# Patient Record
Sex: Male | Born: 1940 | Race: White | Hispanic: No | Marital: Married | Smoking: Former smoker
Health system: Southern US, Community
[De-identification: ages and names within clinical notes are randomized; demographics above are authoritative.]

## PROBLEM LIST (undated history)

## (undated) DIAGNOSIS — I2510 Atherosclerotic heart disease of native coronary artery without angina pectoris: Secondary | ICD-10-CM

## (undated) DIAGNOSIS — I10 Essential (primary) hypertension: Secondary | ICD-10-CM

## (undated) DIAGNOSIS — I9789 Other postprocedural complications and disorders of the circulatory system, not elsewhere classified: Secondary | ICD-10-CM

## (undated) DIAGNOSIS — E785 Hyperlipidemia, unspecified: Secondary | ICD-10-CM

## (undated) DIAGNOSIS — C4490 Unspecified malignant neoplasm of skin, unspecified: Secondary | ICD-10-CM

## (undated) DIAGNOSIS — I2119 ST elevation (STEMI) myocardial infarction involving other coronary artery of inferior wall: Secondary | ICD-10-CM

## (undated) DIAGNOSIS — Z8701 Personal history of pneumonia (recurrent): Secondary | ICD-10-CM

## (undated) DIAGNOSIS — M1990 Unspecified osteoarthritis, unspecified site: Secondary | ICD-10-CM

## (undated) DIAGNOSIS — I4891 Unspecified atrial fibrillation: Secondary | ICD-10-CM

## (undated) HISTORY — DX: Hyperlipidemia, unspecified: E78.5

## (undated) HISTORY — PX: HERNIA REPAIR: SHX51

## (undated) HISTORY — DX: Other postprocedural complications and disorders of the circulatory system, not elsewhere classified: I97.89

## (undated) HISTORY — PX: COLONOSCOPY: SHX5424

## (undated) HISTORY — DX: ST elevation (STEMI) myocardial infarction involving other coronary artery of inferior wall: I21.19

## (undated) HISTORY — DX: Personal history of pneumonia (recurrent): Z87.01

## (undated) HISTORY — DX: Essential (primary) hypertension: I10

## (undated) HISTORY — DX: Atherosclerotic heart disease of native coronary artery without angina pectoris: I25.10

## (undated) HISTORY — DX: Unspecified atrial fibrillation: I48.91

## (undated) HISTORY — PX: CARDIAC CATHETERIZATION: SHX172

---

## 2012-08-29 ENCOUNTER — Other Ambulatory Visit: Payer: Self-pay | Admitting: Family Medicine

## 2012-08-29 DIAGNOSIS — M549 Dorsalgia, unspecified: Secondary | ICD-10-CM

## 2012-09-03 ENCOUNTER — Ambulatory Visit
Admission: RE | Admit: 2012-09-03 | Discharge: 2012-09-03 | Disposition: A | Discharge status: Home / Self Care | Payer: Medicare Other | Source: Ambulatory Visit | Attending: Family Medicine | Admitting: Family Medicine

## 2012-09-03 VITALS — BP 155/85 | HR 62 | Ht 67.0 in | Wt 156.0 lb

## 2012-09-03 DIAGNOSIS — M549 Dorsalgia, unspecified: Secondary | ICD-10-CM

## 2012-09-03 MED ORDER — METHYLPREDNISOLONE ACETATE 40 MG/ML INJ SUSP (RADIOLOG
120.0000 mg | Freq: Once | INTRAMUSCULAR | Status: AC
  Administered 2012-09-03: 120 mg via EPIDURAL

## 2012-09-03 MED ORDER — IOHEXOL 180 MG/ML  SOLN
1.0000 mL | Freq: Once | INTRAMUSCULAR | Status: AC | PRN
  Administered 2012-09-03: 1 mL via EPIDURAL

## 2015-04-26 DIAGNOSIS — I2119 ST elevation (STEMI) myocardial infarction involving other coronary artery of inferior wall: Secondary | ICD-10-CM

## 2015-04-26 HISTORY — DX: ST elevation (STEMI) myocardial infarction involving other coronary artery of inferior wall: I21.19

## 2015-05-23 HISTORY — PX: CORONARY ANGIOPLASTY: SHX604

## 2015-06-08 ENCOUNTER — Encounter: Payer: Self-pay | Admitting: *Deleted

## 2015-06-16 ENCOUNTER — Other Ambulatory Visit: Payer: Self-pay | Admitting: *Deleted

## 2015-06-16 ENCOUNTER — Encounter: Payer: Self-pay | Admitting: Surgery

## 2015-06-16 ENCOUNTER — Institutional Professional Consult (permissible substitution) (INDEPENDENT_AMBULATORY_CARE_PROVIDER_SITE_OTHER): Payer: Commercial Managed Care - HMO | Admitting: Surgery

## 2015-06-16 VITALS — BP 112/70 | HR 60 | Resp 20 | Ht 67.0 in | Wt 165.0 lb

## 2015-06-16 DIAGNOSIS — I2111 ST elevation (STEMI) myocardial infarction involving right coronary artery: Secondary | ICD-10-CM

## 2015-06-16 DIAGNOSIS — I25110 Atherosclerotic heart disease of native coronary artery with unstable angina pectoris: Secondary | ICD-10-CM | POA: Diagnosis not present

## 2015-06-16 DIAGNOSIS — Z955 Presence of coronary angioplasty implant and graft: Secondary | ICD-10-CM

## 2015-06-16 DIAGNOSIS — I2510 Atherosclerotic heart disease of native coronary artery without angina pectoris: Secondary | ICD-10-CM

## 2015-06-17 ENCOUNTER — Encounter (HOSPITAL_COMMUNITY): Payer: Commercial Managed Care - HMO

## 2015-06-18 ENCOUNTER — Encounter: Payer: Self-pay | Admitting: Surgery

## 2015-06-18 DIAGNOSIS — I2111 ST elevation (STEMI) myocardial infarction involving right coronary artery: Secondary | ICD-10-CM | POA: Insufficient documentation

## 2015-06-18 DIAGNOSIS — I2510 Atherosclerotic heart disease of native coronary artery without angina pectoris: Secondary | ICD-10-CM | POA: Insufficient documentation

## 2015-06-18 DIAGNOSIS — Z955 Presence of coronary angioplasty implant and graft: Secondary | ICD-10-CM | POA: Insufficient documentation

## 2015-06-18 NOTE — Progress Notes (Signed)
Cardiothoracic Surgery Consultation  PCP is Curlene Labrum, MD Referring Provider is Burdine, Virgina Evener, MD  Chief Complaint  Patient presents with  . Coronary Artery Disease    surgical eval for possible CABG, Cardiac Cath @ [facility]    HPI:  The patient is a 74 year old gentleman with a history of hypertension and hyperlipidemia who was traveling in [place] and suffered and inferior STEMI on 05/23/2015. He developed substernal chest discomfort described as pressure radiating to both arms and associated with diaphoresis and shortness of breath.  Cath showed an occluded RCA as well as severe multi-vessel coronary artery disease. He underwent successful PCI of the RCA with 2 drug eluting stents (Synergy 3.5 x 38 mm and 3.5 x 20 mm). The peak troponin I that was measured was 3.26. He had an uneventful course and was discharged on ASA and Plavix. He followed up with Dr. Pleas Koch and was referred to me for consideration of CABG. He says that he has felt well since discharge with no chest discomfort or shortness of breath.  Past Medical History  Diagnosis Date  . CAD (coronary artery disease)   . HLD (hyperlipidemia)   . Hypertension   . Heart attack     Past Surgical History  Procedure Laterality Date  . Hernia repair    . Coronary angioplasty with stent placement      Family History  Problem Relation Age of Onset  . Heart disease    . Hyperlipidemia Mother   . Cancer Father   . Hyperlipidemia Father   . Hypertension Father   . Heart disease Father   . Kidney cancer Father   . Leukemia Father     Social History Social History  Substance Use Topics  . Smoking status: Former Smoker    Types: Cigarettes    Start date: 09/25/1974  . Smokeless tobacco: None  . Alcohol Use: No    Current Outpatient Prescriptions  Medication Sig Dispense Refill  . aspirin 81 MG chewable tablet Chew 81 mg by mouth daily.   1  . atorvastatin (LIPITOR) 80 MG tablet Take 80  mg by mouth daily at 6 PM.   1  . clopidogrel (PLAVIX) 75 MG tablet Take 75 mg by mouth daily.   3  . co-enzyme Q-10 30 MG capsule     . lisinopril (PRINIVIL,ZESTRIL) 2.5 MG tablet Take 2.5 mg by mouth daily.   3  . metoprolol succinate (TOPROL-XL) 25 MG 24 hr tablet Take 25 mg by mouth daily.   0  . NITROSTAT 0.4 MG SL tablet Place 0.4 mg under the tongue every 5 (five) minutes as needed.   2  . vitamin B-12 (CYANOCOBALAMIN) 100 MCG tablet      No current facility-administered medications for this visit.    No Known Allergies  Review of Systems  Constitutional: Negative.  Negative for fatigue.  HENT: Negative.   Eyes: Negative.   Respiratory: Negative.  Negative for shortness of breath.   Cardiovascular: Negative for chest pain, palpitations and leg swelling.  Gastrointestinal: Negative.   Endocrine: Negative.   Genitourinary: Negative.   Musculoskeletal: Negative.   Skin: Negative.   Neurological: Negative.   Hematological: Negative.   Psychiatric/Behavioral: Negative.     BP 112/70 mmHg  Pulse 60  Resp 20  Ht 5\' 7"  (1.702 m)  Wt 165 lb (74.844 kg)  BMI 25.84 kg/m2  SpO2 98% Physical Exam  Constitutional: He is oriented to person, place, and time. He  appears well-developed and well-nourished.  HENT:  Head: Normocephalic and atraumatic.  Mouth/Throat: Oropharynx is clear and moist.  Eyes: EOM are normal. Pupils are equal, round, and reactive to light.  Neck: Normal range of motion. Neck supple. No JVD present. No tracheal deviation present. No thyromegaly present.  Cardiovascular: Normal rate, regular rhythm, normal heart sounds and intact distal pulses.  Exam reveals no gallop and no friction rub.   No murmur heard. Pulmonary/Chest: Effort normal and breath sounds normal. No respiratory distress. He has no wheezes. He has no rales.  Abdominal: Soft. Bowel sounds are normal. He exhibits no distension and no mass. There is no tenderness.  Musculoskeletal: Normal range  of motion. He exhibits no edema.  Lymphadenopathy:    He has no cervical adenopathy.  Neurological: He is alert and oriented to person, place, and time. He has normal strength. No cranial nerve deficit or sensory deficit.  Skin: Skin is warm and dry.  Psychiatric: He has a normal mood and affect.     Diagnostic Tests:  I have personally reviewed his cath films from his hospitalization in [place]. This shows an acute thrombotic occlusion of the proximal RCA with collaterals to the distal RCA from the left. The PDA is occluded at its origin with filling of the vessel by collat from the left. The LAD has a 70-80% proximal stenosis before the diagonal branch. The medial branch of the diagonal has 95% stenosis and is a moderate sized vessel. The LCX has an occluded OM that fills by bridging collat and is moderate sized. LV function is normal. The RCA was opened with PCI and stented proximally and mid. There is moderate distal disease before the PL branch which has 70% mid and 90% distal stenoses.   Impression:  He has severe 3-vessel coronary disease presenting with an acute inferior STEMI due to occlusion of the proximal RCA. This was opened successfully with 2 DES with mild elevation of the troponin and normal LV function. He is asymptomatic at this time on ASA and Plavix. I think CABG is indicated given the severity of his other disease, particularly the LAD/diag and OM. He has an appt next week to see Dr. Domenic Polite in [facility] for initial cardiology evaluation and I will wait to see his recommendation. He would need to be on DAPT for at least a month before stopping the Plavix for surgery and then would need these restarted afterwards.   Plan:  I have tentatively scheduled him for CABG on 07/08/2015 at his request but told him that this may change depending on recommendations from Dr. Domenic Polite after his visit.   Gaye Pollack, MD [facility] [phone]

## 2015-06-24 ENCOUNTER — Encounter: Payer: Self-pay | Admitting: Cardiology

## 2015-06-24 ENCOUNTER — Ambulatory Visit (INDEPENDENT_AMBULATORY_CARE_PROVIDER_SITE_OTHER): Payer: Commercial Managed Care - HMO | Admitting: Cardiology

## 2015-06-24 ENCOUNTER — Other Ambulatory Visit: Payer: Self-pay

## 2015-06-24 ENCOUNTER — Telehealth: Payer: Self-pay | Admitting: *Deleted

## 2015-06-24 ENCOUNTER — Ambulatory Visit (INDEPENDENT_AMBULATORY_CARE_PROVIDER_SITE_OTHER): Payer: Commercial Managed Care - HMO

## 2015-06-24 VITALS — BP 108/70 | HR 55 | Ht 67.0 in | Wt 166.0 lb

## 2015-06-24 DIAGNOSIS — I2510 Atherosclerotic heart disease of native coronary artery without angina pectoris: Secondary | ICD-10-CM

## 2015-06-24 DIAGNOSIS — I2111 ST elevation (STEMI) myocardial infarction involving right coronary artery: Secondary | ICD-10-CM | POA: Diagnosis not present

## 2015-06-24 NOTE — Telephone Encounter (Signed)
-----   Message from Satira Sark, MD sent at 06/24/2015  4:30 PM EDT ----- Reviewed. LVEF normal range with basal inferior hypokinesis likely consistent with his recent infarct.

## 2015-06-24 NOTE — Telephone Encounter (Signed)
Patient informed. 

## 2015-06-24 NOTE — Progress Notes (Signed)
Cardiology Office Note  Date: 06/24/2015   ID: MAURICE FOTHERINGHAM, DOB 11-30-1940, MRN [id number]  PCP: Curlene Labrum, MD  Consulting Cardiologist: Rozann Lesches, MD   Chief Complaint  Patient presents with  . Coronary Artery Disease    History of Present Illness: AARYA QUEBEDEAUX is a 74 y.o. male referred for cardiology consultation by Dr. Pleas Koch. Available records indicate that the patient was visiting in [place] back in August at which time he developed chest pain and was hospitalized at [facility] in [place] with an inferior wall STEMI. He underwent placement of DES (Synergy 3.5 x 38 mm and 3.5 x 20 mm) to the proximal and mid RCA (culprit lesions), but was also found to have multivessel distribution residual CAD, and ultimately CABG was recommended. I see that he has actually already been evaluated by Dr. Cyndia Bent with [facility] who had the opportunity to review the patient's cardiac catheterization films. LAD was described as having a 70-80% proximal stenosis prior to the diagonal branch, 95% diagonal, occluded OM associated with bridging collaterals, also occluded PDA at the origin. CABG was recommended, and he is in fact already scheduled for early October.  He is here today with his wife. He states that he has been compliant with his medications outlined below, has had occasional brief episodes of chest discomfort without obvious precipitant since hospital discharge in August. He reports NYHA class II dyspnea. He is not pursuing any heavy exertion at this time, has been doing basic ADLs and walking. He had several questions about his overall status, appropriate diet, and was under the impression that we would decide today whether he needed surgery or not.  Labwork from [place] is outlined below, peak troponin I was 23.9. It looks like an echocardiogram was done, however I could not find the results within the records.  The patient and his wife have lived in [place] for about 5  years. Prior to that they lived in the [place] area, decided to move due to the high cost of living. It sounds like they essentially picked [place] off of the map with no other contacts in this area. 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  We had a lengthy discussion today about his presentation with myocardial infarction, findings of his cardiac catheterization based on available information, general indications for coronary revascularization, appropriate medical therapy for risk reduction, and also appropriate diet and ultimately regular exercise plan. He does recall being told by the "chief of cardiology" in [place] that he needed to have CABG, and that PCI was not a good option. I explained that I would have one of our interventional cardiologist review his films in detail sit that we could confirm this recommendation. We also discussed getting an echocardiogram so that LVEF is known at baseline.   Past Medical History  Diagnosis Date  . CAD (coronary artery disease)     DES to proximal and mid RCA May 23, 2015 - [place]  . Hyperlipidemia   . Essential hypertension   . Inferior STEMI August 2016    Past Surgical History  Procedure Laterality Date  . Hernia repair      Current Outpatient Prescriptions  Medication Sig Dispense Refill  . aspirin 81 MG chewable tablet Chew 81 mg by mouth daily.   1  . atorvastatin (LIPITOR) 80 MG tablet Take 80 mg by mouth daily at 6 PM.   1  .  clopidogrel (PLAVIX) 75 MG tablet Take 75 mg by mouth daily.   3  . lisinopril (PRINIVIL,ZESTRIL) 2.5 MG tablet Take 2.5 mg by mouth daily.   3  . Magnesium 100 MG TABS Take 1 tablet by mouth daily.    . metoprolol succinate (TOPROL-XL) 25 MG 24 hr tablet Take 25 mg by mouth daily.   0  . NITROSTAT 0.4 MG SL tablet Place 0.4 mg under the tongue every 5 (five) minutes as needed.   2  . Potassium 99 MG TABS Take 1 tablet by  mouth daily.    . vitamin B-12 (CYANOCOBALAMIN) 100 MCG tablet Take 100 mcg by mouth daily.     Marland Kitchen VITAMIN D, CHOLECALCIFEROL, PO Take 1 tablet by mouth daily.     No current facility-administered medications for this visit.    Allergies:  Review of patient's allergies indicates no known allergies.   Social History: The patient  reports that he has quit smoking. His smoking use included Cigarettes. He started smoking about 40 years ago. He does not have any smokeless tobacco history on file. He reports that he does not drink alcohol or use illicit drugs.   Family History: The patient's family history includes Cancer in his father; Heart disease in his father and another family member; Hyperlipidemia in his father and mother; Hypertension in his father; Kidney cancer in his father; Leukemia in his father.   ROS:  Please see the history of present illness. Otherwise, complete review of systems is positive for none.  All other systems are reviewed and negative.   Physical Exam: VS:  BP 108/70 mmHg  Pulse 55  Ht 5\' 7"  (1.702 m)  Wt 166 lb (75.297 kg)  BMI 25.99 kg/m2  SpO2 98%, BMI Body mass index is 25.99 kg/(m^2).  Wt Readings from Last 3 Encounters:  06/24/15 166 lb (75.297 kg)  06/16/15 165 lb (74.844 kg)  09/03/12 156 lb (70.761 kg)     General: Patient appears comfortable at rest. HEENT: Conjunctiva and lids normal, oropharynx clear with moist mucosa. Neck: Supple, no elevated JVP or carotid bruits, no thyromegaly. Lungs: Clear to auscultation, nonlabored breathing at rest. Cardiac: Regular rate and rhythm, no S3 or significant systolic murmur, no pericardial rub. Abdomen: Soft, nontender, bowel sounds present. Extremities: No pitting edema, distal pulses 2+. Skin: Warm and dry. Musculoskeletal: No kyphosis. Neuropsychiatric: Alert and oriented x3, affect grossly appropriate.   ECG: ECG is not ordered today.   Recent Labwork:  August 2016: BUN 24, creatinine 0.8,  hemoglobin 14.9, platelets 201, cholesterol 287, triglycerides 153, HDL 45, LDL 211, troponin I peak 23.9  Assessment and Plan:  1. Multivessel CAD status post inferior wall STEMI in August. He was treated with DES 2 to the RCA at a facility in [place] as discussed above. At this time he is relatively stable symptomatically with only occasional brief episodes of chest discomfort since hospital discharge. He continues on DAPT, beta blocker, ACE inhibitor, and high-dose statin. CABG has already been recommended for complete revascularization, and he has already seen Dr. Cyndia Bent who agrees and has already scheduled the patient for surgery. My plan is to have the patient's cardiac catheterization films reviewed by Dr. Burt Knack to determine whether PCI is even an option to consider, although it sounds like this was not felt to be an optimal revascularization based on the patient's recollection of discussions in [place]. We will also obtain an echocardiogram to assess LVEF. Our office will plan to contact Mr. Bustamante back later  today after my discussion with Dr. Burt Knack.  2. Hyperlipidemia with LDL 211 during evaluation in [place] back in August. Agree with continuing high-dose Lipitor for now. We will plan to reassess.  3. Essential hypertension, not entirely clear whether this is a long-term diagnosis or not. Patient states that he is not aware of ever having been told that he has high blood pressure. Will continue to observe for now.  Current medicines were reviewed with the patient today.   Orders Placed This Encounter  Procedures  . ECHOCARDIOGRAM COMPLETE    Disposition: Office will contact patient later today after I have discussed the patient's case and films with Dr. Burt Knack. If CABG looks to be the best option to provide complete revascularization, patient is already scheduled and can proceed as per Dr. Cyndia Bent. If on the other hand PCI should be considered, I will have him see Dr.  Burt Knack to discuss further.   Signed, Satira Sark, MD, [facility] 06/24/2015 1:18 PM    [place] [facility] [place], [place], [place] [address] Phone: [phone]; Fax: [phone]

## 2015-06-24 NOTE — Progress Notes (Signed)
This is an addendum to the office consultation earlier today. Since I saw Mr. Geoffrey [name], I was able to discuss his case with Dr. Sherren Mocha who reviewed the patient's cardiac catheterization films. It is felt that the most complete coronary revascularization would be achieved by CABG rather than PCI - not all lesions would be able to be treated percutaneously. This is in line with what the patient was reportedly told in [place], and also Dr. Vivi Martens recent evaluation. He did have an echocardiogram done today which shows LVEF 55-60% with basal inferior hypokinesis, grade 1 diastolic dysfunction, aortic valve sclerosis without obvious stenosis, and normal RV function. Our office will be contacting the patient to let him know about this discussion. He is already scheduled for CABG and can proceed at Dr. Vivi Martens direction.

## 2015-06-24 NOTE — Patient Instructions (Signed)
Your physician recommends that you continue on your current medications as directed. Please refer to the Current Medication list given to you today. Your physician has requested that you have an echocardiogram. Echocardiography is a painless test that uses sound waves to create images of your heart. It provides your doctor with information about the size and shape of your heart and how well your heart's chambers and valves are working. This procedure takes approximately one hour. There are no restrictions for this procedure. We will call you later today with the plan.

## 2015-07-05 NOTE — Pre-Procedure Instructions (Signed)
Geoffrey [name]  07/05/2015     Your procedure is scheduled on : Thursday July 08, 2015 at 7:30 AM.  Report to [facility] Admitting at 5:30 A.M.  Call this number if you have problems the morning of surgery: [phone]    Remember:  Do not eat food or drink liquids after midnight.  Take these medicines the morning of surgery with A SIP OF WATER : Metoprolol (Toprol XL)   Stop taking any vitamins, herbal medications, Ibuprofen, Advil, Motrin, Aleve, etc   Please follow your physicians instructions regarding Plavix   Do not wear jewelry.  Do not wear lotions, powders, or cologne.    Men may shave face and neck.  Do not bring valuables to the hospital.  [facility] is not responsible for any belongings or valuables.  Contacts, dentures or bridgework may not be worn into surgery.  Leave your suitcase in the car.  After surgery it may be brought to your room.  For patients admitted to the hospital, discharge time will be determined by your treatment team.  Patients discharged the day of surgery will not be allowed to drive home.   Name and phone number of your driver:    Special instructions:  Shower using CHG soap the night before and the morning of your surgery  Please read over the following fact sheets that you were given. Pain Booklet, Coughing and Deep Breathing, Blood Transfusion Information, Open Heart Packet, MRSA Information and Surgical Site Infection Prevention

## 2015-07-06 ENCOUNTER — Encounter (HOSPITAL_COMMUNITY)
Admission: RE | Admit: 2015-07-06 | Discharge: 2015-07-06 | Disposition: A | Discharge status: Home / Self Care | Payer: Commercial Managed Care - HMO | Source: Ambulatory Visit | Attending: Surgery | Admitting: Surgery

## 2015-07-06 ENCOUNTER — Ambulatory Visit (HOSPITAL_BASED_OUTPATIENT_CLINIC_OR_DEPARTMENT_OTHER)
Admission: RE | Admit: 2015-07-06 | Discharge: 2015-07-06 | Disposition: A | Discharge status: Home / Self Care | Payer: Commercial Managed Care - HMO | Source: Ambulatory Visit | Attending: Surgery | Admitting: Surgery

## 2015-07-06 ENCOUNTER — Encounter (HOSPITAL_COMMUNITY): Payer: Self-pay

## 2015-07-06 ENCOUNTER — Ambulatory Visit (HOSPITAL_COMMUNITY)
Admission: RE | Admit: 2015-07-06 | Discharge: 2015-07-06 | Disposition: A | Discharge status: Home / Self Care | Payer: Commercial Managed Care - HMO | Source: Ambulatory Visit | Attending: Surgery | Admitting: Surgery

## 2015-07-06 VITALS — BP 114/72 | HR 57 | Temp 97.7°F | Resp 20 | Ht 67.0 in | Wt 167.1 lb

## 2015-07-06 DIAGNOSIS — Z0183 Encounter for blood typing: Secondary | ICD-10-CM

## 2015-07-06 DIAGNOSIS — I6523 Occlusion and stenosis of bilateral carotid arteries: Secondary | ICD-10-CM | POA: Insufficient documentation

## 2015-07-06 DIAGNOSIS — R0602 Shortness of breath: Secondary | ICD-10-CM

## 2015-07-06 DIAGNOSIS — I2510 Atherosclerotic heart disease of native coronary artery without angina pectoris: Secondary | ICD-10-CM

## 2015-07-06 DIAGNOSIS — Z01812 Encounter for preprocedural laboratory examination: Secondary | ICD-10-CM | POA: Insufficient documentation

## 2015-07-06 DIAGNOSIS — Z01818 Encounter for other preprocedural examination: Secondary | ICD-10-CM | POA: Insufficient documentation

## 2015-07-06 DIAGNOSIS — R9431 Abnormal electrocardiogram [ECG] [EKG]: Secondary | ICD-10-CM | POA: Insufficient documentation

## 2015-07-06 HISTORY — DX: Unspecified osteoarthritis, unspecified site: M19.90

## 2015-07-06 HISTORY — DX: Unspecified malignant neoplasm of skin, unspecified: C44.90

## 2015-07-06 LAB — COMPREHENSIVE METABOLIC PANEL
ALBUMIN: 3.8 g/dL (ref 3.5–5.0)
ALT: 25 U/L (ref 17–63)
AST: 26 U/L (ref 15–41)
Alkaline Phosphatase: 85 U/L (ref 38–126)
Anion gap: 10 (ref 5–15)
BUN: 16 mg/dL (ref 6–20)
CHLORIDE: 102 mmol/L (ref 101–111)
CO2: 22 mmol/L (ref 22–32)
Calcium: 8.9 mg/dL (ref 8.9–10.3)
Creatinine, Ser: 0.69 mg/dL (ref 0.61–1.24)
GFR calc Af Amer: >60 mL/min (ref >60)
Glucose, Bld: 102 mg/dL — ABNORMAL HIGH (ref 65–99)
POTASSIUM: 4.3 mmol/L (ref 3.5–5.1)
SODIUM: 134 mmol/L — AB (ref 135–145)
Total Bilirubin: 0.8 mg/dL (ref 0.3–1.2)
Total Protein: 6.9 g/dL (ref 6.5–8.1)

## 2015-07-06 LAB — URINALYSIS, ROUTINE W REFLEX MICROSCOPIC
Bilirubin Urine: NEGATIVE
GLUCOSE, UA: NEGATIVE mg/dL
HGB URINE DIPSTICK: NEGATIVE
Ketones, ur: NEGATIVE mg/dL
Leukocytes, UA: NEGATIVE
Nitrite: NEGATIVE
PROTEIN: NEGATIVE mg/dL
SPECIFIC GRAVITY, URINE: 1.018 (ref 1.005–1.030)
Urobilinogen, UA: 0.2 mg/dL (ref 0.0–1.0)
pH: 7.5 (ref 5.0–8.0)

## 2015-07-06 LAB — PULMONARY FUNCTION TEST
DL/VA % pred: 86 %
DL/VA: 3.75 ml/min/mmHg/L
DLCO COR: 21.86 ml/min/mmHg
DLCO UNC % PRED: 80 %
DLCO cor % pred: 81 %
DLCO unc: 21.73 ml/min/mmHg
FEF 25-75 PRE: 3.13 L/s
FEF 25-75 Post: 4.75 L/sec
FEF2575-%Change-Post: 51 %
FEF2575-%PRED-PRE: 157 %
FEF2575-%Pred-Post: 238 %
FEV1-%Change-Post: 8 %
FEV1-%PRED-PRE: 120 %
FEV1-%Pred-Post: 131 %
FEV1-POST: 3.5 L
FEV1-Pre: 3.21 L
FEV1FVC-%Change-Post: 6 %
FEV1FVC-%Pred-Pre: 107 %
FEV6-%CHANGE-POST: 4 %
FEV6-%PRED-PRE: 115 %
FEV6-%Pred-Post: 120 %
FEV6-POST: 4.16 L
FEV6-Pre: 3.97 L
FEV6FVC-%CHANGE-POST: 0 %
FEV6FVC-%PRED-POST: 106 %
FEV6FVC-%Pred-Pre: 105 %
FVC-%Change-Post: 1 %
FVC-%PRED-POST: 113 %
FVC-%Pred-Pre: 111 %
FVC-Post: 4.16 L
FVC-Pre: 4.08 L
POST FEV6/FVC RATIO: 100 %
PRE FEV1/FVC RATIO: 79 %
Post FEV1/FVC ratio: 84 %
Pre FEV6/FVC Ratio: 99 %
RV % pred: 87 %
RV: 1.99 L
TLC % PRED: 102 %
TLC: 6.41 L

## 2015-07-06 LAB — ABO/RH: ABO/RH(D): A POS

## 2015-07-06 LAB — BLOOD GAS, ARTERIAL
ACID-BASE DEFICIT: 0 mmol/L (ref 0.0–2.0)
Bicarbonate: 23.8 mEq/L (ref 20.0–24.0)
DRAWN BY: 2063611
FIO2: 0.21
O2 SAT: 98.2 %
PATIENT TEMPERATURE: 98.6
TCO2: 24.9 mmol/L (ref 0–100)
pCO2 arterial: 36.4 mmHg (ref 35.0–45.0)
pH, Arterial: 7.43 (ref 7.350–7.450)
pO2, Arterial: 114 mmHg — ABNORMAL HIGH (ref 80.0–100.0)

## 2015-07-06 LAB — PROTIME-INR
INR: 1.07 (ref 0.00–1.49)
Prothrombin Time: 14.1 seconds (ref 11.6–15.2)

## 2015-07-06 LAB — CBC
HCT: 41.3 % (ref 39.0–52.0)
Hemoglobin: 13.9 g/dL (ref 13.0–17.0)
MCH: 29.6 pg (ref 26.0–34.0)
MCHC: 33.7 g/dL (ref 30.0–36.0)
MCV: 88.1 fL (ref 78.0–100.0)
PLATELETS: 214 10*3/uL (ref 150–400)
RBC: 4.69 MIL/uL (ref 4.22–5.81)
RDW: 13.4 % (ref 11.5–15.5)
WBC: 7.6 10*3/uL (ref 4.0–10.5)

## 2015-07-06 LAB — APTT: APTT: 30 s (ref 24–37)

## 2015-07-06 LAB — SURGICAL PCR SCREEN
MRSA, PCR: NEGATIVE
Staphylococcus aureus: NEGATIVE

## 2015-07-06 LAB — GLUCOSE, CAPILLARY: GLUCOSE-CAPILLARY: 124 mg/dL — AB (ref 65–99)

## 2015-07-06 MED ORDER — ALBUTEROL SULFATE (2.5 MG/3ML) 0.083% IN NEBU
2.5000 mg | INHALATION_SOLUTION | Freq: Once | RESPIRATORY_TRACT | Status: AC
  Administered 2015-07-06: 2.5 mg via RESPIRATORY_TRACT

## 2015-07-06 NOTE — Progress Notes (Signed)
Pre-op Cardiac Surgery  Carotid Findings:  Findings suggest 1-39% internal carotid artery stenosis bilaterally. Vertebral arteries are patent with antegrade flow.   Upper Extremity Right Left  Brachial Pressures 118-Triphasic 120-Triphasic  Radial Waveforms Triphasic Triphasic  Ulnar Waveforms Triphasic Triphasic  Palmar Arch (Allen's Test) Unable to evaluate due to recent arterial puncture Signal is unaffected with radial compression, decreases <50% with ulnar compression.   Findings:   Bilateral palpable pedal pulses.  07/06/2015 2:09 PM Maudry Mayhew, RVT, RDCS, RDMS

## 2015-07-06 NOTE — Progress Notes (Signed)
PCP is Judd Lien  Patient informed Nurse that he had a MI and had two stents placed May 23, 2015 at [facility] in [place].   Patient denied having any chest pain, discomfort, or shortness of breath  Nurse inquired about Plavix, and patient stated he stopped Plavix on 07/02/15

## 2015-07-07 LAB — HEMOGLOBIN A1C
HEMOGLOBIN A1C: 6.1 % — AB (ref 4.8–5.6)
Mean Plasma Glucose: 128 mg/dL

## 2015-07-07 MED ORDER — MAGNESIUM SULFATE 50 % IJ SOLN
40.0000 meq | INTRAMUSCULAR | Status: DC
  Filled 2015-07-07: qty 10

## 2015-07-07 MED ORDER — VANCOMYCIN HCL 10 G IV SOLR
1250.0000 mg | INTRAVENOUS | Status: AC
  Administered 2015-07-08: 1250 mg via INTRAVENOUS
  Filled 2015-07-07: qty 1250

## 2015-07-07 MED ORDER — EPINEPHRINE HCL 1 MG/ML IJ SOLN
0.0000 ug/min | INTRAMUSCULAR | Status: DC
  Filled 2015-07-07: qty 4

## 2015-07-07 MED ORDER — DEXTROSE 5 % IV SOLN
750.0000 mg | INTRAVENOUS | Status: DC
  Filled 2015-07-07: qty 750

## 2015-07-07 MED ORDER — POTASSIUM CHLORIDE 2 MEQ/ML IV SOLN
80.0000 meq | INTRAVENOUS | Status: DC
  Filled 2015-07-07: qty 40

## 2015-07-07 MED ORDER — PHENYLEPHRINE HCL 10 MG/ML IJ SOLN
30.0000 ug/min | INTRAVENOUS | Status: DC
  Filled 2015-07-07: qty 2

## 2015-07-07 MED ORDER — DEXMEDETOMIDINE HCL IN NACL 400 MCG/100ML IV SOLN
0.1000 ug/kg/h | INTRAVENOUS | Status: AC
  Administered 2015-07-08: .3 ug/kg/h via INTRAVENOUS
  Filled 2015-07-07: qty 100

## 2015-07-07 MED ORDER — DEXTROSE 5 % IV SOLN
1.5000 g | INTRAVENOUS | Status: AC
  Administered 2015-07-08: 1.5 g via INTRAVENOUS
  Administered 2015-07-08: .75 g via INTRAVENOUS
  Filled 2015-07-07: qty 1.5

## 2015-07-07 MED ORDER — DOPAMINE-DEXTROSE 3.2-5 MG/ML-% IV SOLN
0.0000 ug/kg/min | INTRAVENOUS | Status: DC
  Filled 2015-07-07: qty 250

## 2015-07-07 MED ORDER — NITROGLYCERIN IN D5W 200-5 MCG/ML-% IV SOLN
2.0000 ug/min | INTRAVENOUS | Status: AC
  Administered 2015-07-08: 5 ug/min via INTRAVENOUS
  Filled 2015-07-07: qty 250

## 2015-07-07 MED ORDER — SODIUM CHLORIDE 0.9 % IV SOLN
INTRAVENOUS | Status: AC
  Administered 2015-07-08: .7 [IU]/h via INTRAVENOUS
  Filled 2015-07-07: qty 2.5

## 2015-07-07 MED ORDER — PLASMA-LYTE 148 IV SOLN
INTRAVENOUS | Status: AC
  Administered 2015-07-08: 500 mL
  Filled 2015-07-07: qty 2.5

## 2015-07-07 MED ORDER — HEPARIN SODIUM (PORCINE) 1000 UNIT/ML IJ SOLN
INTRAMUSCULAR | Status: DC
  Filled 2015-07-07: qty 30

## 2015-07-07 MED ORDER — SODIUM CHLORIDE 0.9 % IV SOLN
INTRAVENOUS | Status: AC
  Administered 2015-07-08: 69 mL/h via INTRAVENOUS
  Filled 2015-07-07: qty 40

## 2015-07-07 MED ORDER — CHLORHEXIDINE GLUCONATE 0.12 % MT SOLN
15.0000 mL | Freq: Once | OROMUCOSAL | Status: DC
  Filled 2015-07-07: qty 15

## 2015-07-07 MED ORDER — METOPROLOL TARTRATE 12.5 MG HALF TABLET: 12.5000 mg | ORAL_TABLET | Freq: Once | ORAL | Status: DC

## 2015-07-07 NOTE — H&P (Signed)
[name]       [name]             [phone]      Cardiothoracic Surgery History and Physical   PCP is Curlene Labrum, MD Referring Provider is Burdine, Virgina Evener, MD  Chief Complaint  Patient presents with  . Coronary Artery Disease   HPI:  The patient is a 74 year old gentleman with a history of hypertension and hyperlipidemia who was traveling in [place] and suffered and inferior STEMI on 05/23/2015. He developed substernal chest discomfort described as pressure radiating to both arms and associated with diaphoresis and shortness of breath. Cath showed an occluded RCA as well as severe multi-vessel coronary artery disease. He underwent successful PCI of the RCA with 2 drug eluting stents (Synergy 3.5 x 38 mm and 3.5 x 20 mm). The peak troponin I that was measured was 3.26. He had an uneventful course and was discharged on ASA and Plavix. He followed up with Dr. Pleas Koch and was referred to me for consideration of CABG. He says that he has felt well since discharge with no chest discomfort or shortness of breath.  Past Medical History  Diagnosis Date  . CAD (coronary artery disease)   . HLD (hyperlipidemia)   . Hypertension   . Heart attack     Past Surgical History  Procedure Laterality Date  . Hernia repair    . Coronary angioplasty with stent placement      Family History  Problem Relation Age of Onset  . Heart disease    . Hyperlipidemia Mother   . Cancer Father   . Hyperlipidemia Father   . Hypertension Father   . Heart disease Father   . Kidney cancer Father   . Leukemia Father     Social History Social History  Substance Use Topics  . Smoking status: Former Smoker    Types: Cigarettes    Start date: 09/25/1974  . Smokeless tobacco: None  . Alcohol Use: No    Current Outpatient Prescriptions  Medication Sig  Dispense Refill  . aspirin 81 MG chewable tablet Chew 81 mg by mouth daily.   1  . atorvastatin (LIPITOR) 80 MG tablet Take 80 mg by mouth daily at 6 PM.   1  . clopidogrel (PLAVIX) 75 MG tablet Take 75 mg by mouth daily.   3  . co-enzyme Q-10 30 MG capsule     . lisinopril (PRINIVIL,ZESTRIL) 2.5 MG tablet Take 2.5 mg by mouth daily.   3  . metoprolol succinate (TOPROL-XL) 25 MG 24 hr tablet Take 25 mg by mouth daily.   0  . NITROSTAT 0.4 MG SL tablet Place 0.4 mg under the tongue every 5 (five) minutes as needed.   2  . vitamin B-12 (CYANOCOBALAMIN) 100 MCG tablet      No current facility-administered medications for this visit.    No Known Allergies  Review of Systems  Constitutional: Negative. Negative for fatigue.  HENT: Negative.  Eyes: Negative.  Respiratory: Negative. Negative for shortness of breath.  Cardiovascular: Negative for chest pain, palpitations and leg swelling.  Gastrointestinal: Negative.  Endocrine: Negative.  Genitourinary: Negative.  Musculoskeletal: Negative.  Skin: Negative.  Neurological: Negative.  Hematological: Negative.  Psychiatric/Behavioral: Negative.    BP 112/70 mmHg  Pulse 60  Resp 20  Ht 5\' 7"  (1.702 m)  Wt 165 lb (74.844 kg)  BMI 25.84 kg/m2  SpO2 98% Physical Exam  Constitutional:  He is oriented to person, place, and time. He appears well-developed and well-nourished.  HENT:  Head: Normocephalic and atraumatic.  Mouth/Throat: Oropharynx is clear and moist.  Eyes: EOM are normal. Pupils are equal, round, and reactive to light.  Neck: Normal range of motion. Neck supple. No JVD present. No tracheal deviation present. No thyromegaly present.  Cardiovascular: Normal rate, regular rhythm, normal heart sounds and intact distal pulses. Exam reveals no gallop and no friction rub.  No murmur heard. Pulmonary/Chest: Effort normal and breath sounds normal. No respiratory distress.  He has no wheezes. He has no rales.  Abdominal: Soft. Bowel sounds are normal. He exhibits no distension and no mass. There is no tenderness.  Musculoskeletal: Normal range of motion. He exhibits no edema.  Lymphadenopathy:   He has no cervical adenopathy.  Neurological: He is alert and oriented to person, place, and time. He has normal strength. No cranial nerve deficit or sensory deficit.  Skin: Skin is warm and dry.  Psychiatric: He has a normal mood and affect.     Diagnostic Tests:  I have personally reviewed his cath films from his hospitalization in [place]. This shows an acute thrombotic occlusion of the proximal RCA with collaterals to the distal RCA from the left. The PDA is occluded at its origin with filling of the vessel by collat from the left. The LAD has a 70-80% proximal stenosis before the diagonal branch. The medial branch of the diagonal has 95% stenosis and is a moderate sized vessel. The LCX has an occluded OM that fills by bridging collat and is moderate sized. LV function is normal. The RCA was opened with PCI and stented proximally and mid. There is moderate distal disease before the PL branch which has 70% mid and 90% distal stenoses.   Impression:  He has severe 3-vessel coronary disease presenting with an acute inferior STEMI due to occlusion of the proximal RCA. This was opened successfully with 2 DES with mild elevation of the troponin and normal LV function. He is asymptomatic at this time on ASA and Plavix. I think CABG is indicated given the severity of his other disease, particularly the LAD/diag and OM.  He would need to be on DAPT for at least a month before stopping the Plavix for surgery and then would need these restarted afterwards. I discussed the operative procedure with the patient and family including alternatives, benefits and risks; including but not limited to bleeding, blood transfusion, infection, stroke, myocardial infarction, graft failure, heart  block requiring a permanent pacemaker, organ dysfunction, and death.  Geoffrey [name] understands and agrees to proceed.    Plan:  CABG on 07/08/2015.   Gaye Pollack, MD [facility] [phone]

## 2015-07-08 ENCOUNTER — Encounter (HOSPITAL_COMMUNITY)
Admission: RE | Disposition: A | Discharge status: Home / Self Care | Payer: Commercial Managed Care - HMO | Source: Ambulatory Visit | Attending: Surgery

## 2015-07-08 ENCOUNTER — Inpatient Hospital Stay (HOSPITAL_COMMUNITY): Payer: Commercial Managed Care - HMO

## 2015-07-08 ENCOUNTER — Encounter (HOSPITAL_COMMUNITY): Payer: Self-pay | Admitting: General Practice

## 2015-07-08 ENCOUNTER — Inpatient Hospital Stay (HOSPITAL_COMMUNITY)
Admission: RE | Admit: 2015-07-08 | Discharge: 2015-07-16 | DRG: 236 | Disposition: A | Discharge status: Home / Self Care | Payer: Commercial Managed Care - HMO | Source: Ambulatory Visit | Attending: Surgery | Admitting: Surgery

## 2015-07-08 ENCOUNTER — Inpatient Hospital Stay (HOSPITAL_COMMUNITY): Payer: Commercial Managed Care - HMO | Admitting: Certified Registered"

## 2015-07-08 DIAGNOSIS — I9789 Other postprocedural complications and disorders of the circulatory system, not elsewhere classified: Secondary | ICD-10-CM | POA: Diagnosis not present

## 2015-07-08 DIAGNOSIS — Y832 Surgical operation with anastomosis, bypass or graft as the cause of abnormal reaction of the patient, or of later complication, without mention of misadventure at the time of the procedure: Secondary | ICD-10-CM | POA: Diagnosis not present

## 2015-07-08 DIAGNOSIS — Z806 Family history of leukemia: Secondary | ICD-10-CM

## 2015-07-08 DIAGNOSIS — D62 Acute posthemorrhagic anemia: Secondary | ICD-10-CM | POA: Diagnosis not present

## 2015-07-08 DIAGNOSIS — Z7902 Long term (current) use of antithrombotics/antiplatelets: Secondary | ICD-10-CM | POA: Diagnosis not present

## 2015-07-08 DIAGNOSIS — I252 Old myocardial infarction: Secondary | ICD-10-CM | POA: Diagnosis not present

## 2015-07-08 DIAGNOSIS — I2510 Atherosclerotic heart disease of native coronary artery without angina pectoris: Secondary | ICD-10-CM | POA: Diagnosis present

## 2015-07-08 DIAGNOSIS — K5900 Constipation, unspecified: Secondary | ICD-10-CM | POA: Diagnosis not present

## 2015-07-08 DIAGNOSIS — Z79899 Other long term (current) drug therapy: Secondary | ICD-10-CM | POA: Diagnosis not present

## 2015-07-08 DIAGNOSIS — Z8249 Family history of ischemic heart disease and other diseases of the circulatory system: Secondary | ICD-10-CM

## 2015-07-08 DIAGNOSIS — E785 Hyperlipidemia, unspecified: Secondary | ICD-10-CM | POA: Diagnosis present

## 2015-07-08 DIAGNOSIS — Z7982 Long term (current) use of aspirin: Secondary | ICD-10-CM | POA: Diagnosis not present

## 2015-07-08 DIAGNOSIS — Z951 Presence of aortocoronary bypass graft: Secondary | ICD-10-CM

## 2015-07-08 DIAGNOSIS — I25110 Atherosclerotic heart disease of native coronary artery with unstable angina pectoris: Secondary | ICD-10-CM

## 2015-07-08 DIAGNOSIS — I480 Paroxysmal atrial fibrillation: Secondary | ICD-10-CM | POA: Diagnosis not present

## 2015-07-08 DIAGNOSIS — Z87891 Personal history of nicotine dependence: Secondary | ICD-10-CM

## 2015-07-08 DIAGNOSIS — E8779 Other fluid overload: Secondary | ICD-10-CM | POA: Diagnosis not present

## 2015-07-08 DIAGNOSIS — Y92230 Patient room in hospital as the place of occurrence of the external cause: Secondary | ICD-10-CM | POA: Diagnosis not present

## 2015-07-08 DIAGNOSIS — I10 Essential (primary) hypertension: Secondary | ICD-10-CM | POA: Diagnosis present

## 2015-07-08 DIAGNOSIS — Z955 Presence of coronary angioplasty implant and graft: Secondary | ICD-10-CM | POA: Diagnosis not present

## 2015-07-08 DIAGNOSIS — J9811 Atelectasis: Secondary | ICD-10-CM | POA: Diagnosis not present

## 2015-07-08 HISTORY — PX: TEE WITHOUT CARDIOVERSION: SHX5443

## 2015-07-08 HISTORY — PX: CORONARY ARTERY BYPASS GRAFT: SHX141

## 2015-07-08 LAB — POCT I-STAT 4, (NA,K, GLUC, HGB,HCT)
Glucose, Bld: 103 mg/dL — ABNORMAL HIGH (ref 65–99)
HCT: 32 % — ABNORMAL LOW (ref 39.0–52.0)
Hemoglobin: 10.9 g/dL — ABNORMAL LOW (ref 13.0–17.0)
Potassium: 3.6 mmol/L (ref 3.5–5.1)
SODIUM: 141 mmol/L (ref 135–145)

## 2015-07-08 LAB — POCT I-STAT, CHEM 8
BUN: 20 mg/dL (ref 6–20)
BUN: 15 mg/dL (ref 6–20)
BUN: 18 mg/dL (ref 6–20)
BUN: 16 mg/dL (ref 6–20)
BUN: 16 mg/dL (ref 6–20)
BUN: 14 mg/dL (ref 6–20)
CALCIUM ION: 1.2 mmol/L (ref 1.13–1.30)
CALCIUM ION: 1.15 mmol/L (ref 1.13–1.30)
CHLORIDE: 107 mmol/L (ref 101–111)
CHLORIDE: 101 mmol/L (ref 101–111)
CREATININE: 0.5 mg/dL — AB (ref 0.61–1.24)
Calcium, Ion: 0.88 mmol/L — ABNORMAL LOW (ref 1.13–1.30)
Calcium, Ion: 0.95 mmol/L — ABNORMAL LOW (ref 1.13–1.30)
Calcium, Ion: 0.98 mmol/L — ABNORMAL LOW (ref 1.13–1.30)
Calcium, Ion: 1.13 mmol/L (ref 1.13–1.30)
Chloride: 105 mmol/L (ref 101–111)
Chloride: 106 mmol/L (ref 101–111)
Chloride: 106 mmol/L (ref 101–111)
Chloride: 107 mmol/L (ref 101–111)
Creatinine, Ser: 0.4 mg/dL — ABNORMAL LOW (ref 0.61–1.24)
Creatinine, Ser: 0.5 mg/dL — ABNORMAL LOW (ref 0.61–1.24)
Creatinine, Ser: 0.3 mg/dL — ABNORMAL LOW (ref 0.61–1.24)
Creatinine, Ser: 0.5 mg/dL — ABNORMAL LOW (ref 0.61–1.24)
Creatinine, Ser: 0.5 mg/dL — ABNORMAL LOW (ref 0.61–1.24)
GLUCOSE: 109 mg/dL — AB (ref 65–99)
Glucose, Bld: 97 mg/dL (ref 65–99)
Glucose, Bld: 105 mg/dL — ABNORMAL HIGH (ref 65–99)
Glucose, Bld: 91 mg/dL (ref 65–99)
Glucose, Bld: 125 mg/dL — ABNORMAL HIGH (ref 65–99)
Glucose, Bld: 134 mg/dL — ABNORMAL HIGH (ref 65–99)
HCT: 36 % — ABNORMAL LOW (ref 39.0–52.0)
HCT: 33 % — ABNORMAL LOW (ref 39.0–52.0)
HEMATOCRIT: 26 % — AB (ref 39.0–52.0)
HEMATOCRIT: 24 % — AB (ref 39.0–52.0)
HEMATOCRIT: 26 % — AB (ref 39.0–52.0)
HEMATOCRIT: 30 % — AB (ref 39.0–52.0)
HEMOGLOBIN: 11.2 g/dL — AB (ref 13.0–17.0)
HEMOGLOBIN: 8.8 g/dL — AB (ref 13.0–17.0)
HEMOGLOBIN: 8.8 g/dL — AB (ref 13.0–17.0)
Hemoglobin: 12.2 g/dL — ABNORMAL LOW (ref 13.0–17.0)
Hemoglobin: 8.2 g/dL — ABNORMAL LOW (ref 13.0–17.0)
Hemoglobin: 10.2 g/dL — ABNORMAL LOW (ref 13.0–17.0)
POTASSIUM: 4.1 mmol/L (ref 3.5–5.1)
Potassium: 4 mmol/L (ref 3.5–5.1)
Potassium: 4.2 mmol/L (ref 3.5–5.1)
Potassium: 4.5 mmol/L (ref 3.5–5.1)
Potassium: 4.8 mmol/L (ref 3.5–5.1)
Potassium: 4.2 mmol/L (ref 3.5–5.1)
SODIUM: 139 mmol/L (ref 135–145)
SODIUM: 137 mmol/L (ref 135–145)
SODIUM: 138 mmol/L (ref 135–145)
SODIUM: 132 mmol/L — AB (ref 135–145)
SODIUM: 138 mmol/L (ref 135–145)
SODIUM: 138 mmol/L (ref 135–145)
TCO2: 27 mmol/L (ref 0–100)
TCO2: 27 mmol/L (ref 0–100)
TCO2: 27 mmol/L (ref 0–100)
TCO2: 26 mmol/L (ref 0–100)
TCO2: 25 mmol/L (ref 0–100)
TCO2: 23 mmol/L (ref 0–100)

## 2015-07-08 LAB — POCT I-STAT 3, ART BLOOD GAS (G3+)
ACID-BASE DEFICIT: 1 mmol/L (ref 0.0–2.0)
ACID-BASE DEFICIT: 2 mmol/L (ref 0.0–2.0)
ACID-BASE DEFICIT: 2 mmol/L (ref 0.0–2.0)
Acid-Base Excess: 3 mmol/L — ABNORMAL HIGH (ref 0.0–2.0)
Acid-Base Excess: 3 mmol/L — ABNORMAL HIGH (ref 0.0–2.0)
Acid-base deficit: 2 mmol/L (ref 0.0–2.0)
BICARBONATE: 26.8 meq/L — AB (ref 20.0–24.0)
BICARBONATE: 22.3 meq/L (ref 20.0–24.0)
Bicarbonate: 25.3 mEq/L — ABNORMAL HIGH (ref 20.0–24.0)
Bicarbonate: 27.1 mEq/L — ABNORMAL HIGH (ref 20.0–24.0)
Bicarbonate: 23.7 mEq/L (ref 20.0–24.0)
Bicarbonate: 24.5 mEq/L — ABNORMAL HIGH (ref 20.0–24.0)
O2 SAT: 100 %
O2 SAT: 97 %
O2 Saturation: 100 %
O2 Saturation: 100 %
O2 Saturation: 99 %
O2 Saturation: 98 %
PCO2 ART: 45.6 mmHg — AB (ref 35.0–45.0)
PCO2 ART: 41 mmHg (ref 35.0–45.0)
PCO2 ART: 39 mmHg (ref 35.0–45.0)
PCO2 ART: 33.5 mmHg — AB (ref 35.0–45.0)
PH ART: 7.445 (ref 7.350–7.450)
PH ART: 7.43 (ref 7.350–7.450)
PH ART: 7.327 — AB (ref 7.350–7.450)
PO2 ART: 513 mmHg — AB (ref 80.0–100.0)
PO2 ART: 329 mmHg — AB (ref 80.0–100.0)
PO2 ART: 116 mmHg — AB (ref 80.0–100.0)
Patient temperature: 35.6
Patient temperature: 37.1
TCO2: 27 mmol/L (ref 0–100)
TCO2: 28 mmol/L (ref 0–100)
TCO2: 28 mmol/L (ref 0–100)
TCO2: 25 mmol/L (ref 0–100)
TCO2: 23 mmol/L (ref 0–100)
TCO2: 26 mmol/L (ref 0–100)
pCO2 arterial: 42.1 mmHg (ref 35.0–45.0)
pCO2 arterial: 46.8 mmHg — ABNORMAL HIGH (ref 35.0–45.0)
pH, Arterial: 7.352 (ref 7.350–7.450)
pH, Arterial: 7.429 (ref 7.350–7.450)
pH, Arterial: 7.353 (ref 7.350–7.450)
pO2, Arterial: 428 mmHg — ABNORMAL HIGH (ref 80.0–100.0)
pO2, Arterial: 86 mmHg (ref 80.0–100.0)
pO2, Arterial: 115 mmHg — ABNORMAL HIGH (ref 80.0–100.0)

## 2015-07-08 LAB — MAGNESIUM: Magnesium: 3.3 mg/dL — ABNORMAL HIGH (ref 1.7–2.4)

## 2015-07-08 LAB — CBC
HCT: 31.6 % — ABNORMAL LOW (ref 39.0–52.0)
HEMATOCRIT: 33.3 % — AB (ref 39.0–52.0)
HEMOGLOBIN: 11.3 g/dL — AB (ref 13.0–17.0)
Hemoglobin: 10.4 g/dL — ABNORMAL LOW (ref 13.0–17.0)
MCH: 29.7 pg (ref 26.0–34.0)
MCH: 29.1 pg (ref 26.0–34.0)
MCHC: 33.9 g/dL (ref 30.0–36.0)
MCHC: 32.9 g/dL (ref 30.0–36.0)
MCV: 87.4 fL (ref 78.0–100.0)
MCV: 88.5 fL (ref 78.0–100.0)
PLATELETS: 155 10*3/uL (ref 150–400)
Platelets: 142 10*3/uL — ABNORMAL LOW (ref 150–400)
RBC: 3.81 MIL/uL — AB (ref 4.22–5.81)
RBC: 3.57 MIL/uL — AB (ref 4.22–5.81)
RDW: 13.3 % (ref 11.5–15.5)
RDW: 13.3 % (ref 11.5–15.5)
WBC: 14.7 10*3/uL — ABNORMAL HIGH (ref 4.0–10.5)
WBC: 12 10*3/uL — AB (ref 4.0–10.5)

## 2015-07-08 LAB — GLUCOSE, CAPILLARY
Glucose-Capillary: 106 mg/dL — ABNORMAL HIGH (ref 65–99)
Glucose-Capillary: 108 mg/dL — ABNORMAL HIGH (ref 65–99)

## 2015-07-08 LAB — HEMOGLOBIN AND HEMATOCRIT, BLOOD
HCT: 27.5 % — ABNORMAL LOW (ref 39.0–52.0)
HEMOGLOBIN: 9.5 g/dL — AB (ref 13.0–17.0)

## 2015-07-08 LAB — CREATININE, SERUM: CREATININE: 0.69 mg/dL (ref 0.61–1.24)

## 2015-07-08 LAB — PROTIME-INR
INR: 1.46 (ref 0.00–1.49)
Prothrombin Time: 17.8 seconds — ABNORMAL HIGH (ref 11.6–15.2)

## 2015-07-08 LAB — PREPARE RBC (CROSSMATCH)

## 2015-07-08 LAB — APTT: aPTT: 39 seconds — ABNORMAL HIGH (ref 24–37)

## 2015-07-08 LAB — PLATELET COUNT: Platelets: 142 10*3/uL — ABNORMAL LOW (ref 150–400)

## 2015-07-08 SURGERY — CORONARY ARTERY BYPASS GRAFTING (CABG)
Anesthesia: General | Site: Chest

## 2015-07-08 MED ORDER — DEXMEDETOMIDINE HCL IN NACL 200 MCG/50ML IV SOLN
0.0000 ug/kg/h | INTRAVENOUS | Status: DC
  Administered 2015-07-08: 0.7 ug/kg/h via INTRAVENOUS
  Filled 2015-07-08: qty 50

## 2015-07-08 MED ORDER — MORPHINE SULFATE (PF) 2 MG/ML IV SOLN
1.0000 mg | INTRAVENOUS | Status: AC | PRN
  Administered 2015-07-08: 4 mg via INTRAVENOUS
  Administered 2015-07-08: 2 mg via INTRAVENOUS

## 2015-07-08 MED ORDER — FENTANYL CITRATE (PF) 250 MCG/5ML IJ SOLN
INTRAMUSCULAR | Status: AC
  Filled 2015-07-08: qty 5

## 2015-07-08 MED ORDER — PHENYLEPHRINE HCL 10 MG/ML IJ SOLN
0.0000 ug/min | INTRAVENOUS | Status: DC
  Administered 2015-07-10: 50 ug/min via INTRAVENOUS
  Filled 2015-07-08 (×3): qty 2

## 2015-07-08 MED ORDER — PHENYLEPHRINE 40 MCG/ML (10ML) SYRINGE FOR IV PUSH (FOR BLOOD PRESSURE SUPPORT)
PREFILLED_SYRINGE | INTRAVENOUS | Status: AC
  Filled 2015-07-08: qty 10

## 2015-07-08 MED ORDER — PANTOPRAZOLE SODIUM 40 MG PO TBEC
40.0000 mg | DELAYED_RELEASE_TABLET | Freq: Every day | ORAL | Status: DC
  Administered 2015-07-10 – 2015-07-16 (×7): 40 mg via ORAL
  Filled 2015-07-08 (×7): qty 1

## 2015-07-08 MED ORDER — MORPHINE SULFATE (PF) 2 MG/ML IV SOLN
INTRAVENOUS | Status: AC
  Filled 2015-07-08: qty 2

## 2015-07-08 MED ORDER — ROCURONIUM BROMIDE 100 MG/10ML IV SOLN
INTRAVENOUS | Status: DC | PRN
  Administered 2015-07-08: 50 mg via INTRAVENOUS
  Administered 2015-07-08: 40 mg via INTRAVENOUS
  Administered 2015-07-08: 50 mg via INTRAVENOUS
  Administered 2015-07-08: 30 mg via INTRAVENOUS
  Administered 2015-07-08: 50 mg via INTRAVENOUS

## 2015-07-08 MED ORDER — ACETAMINOPHEN 500 MG PO TABS
1000.0000 mg | ORAL_TABLET | Freq: Four times a day (QID) | ORAL | Status: AC
  Administered 2015-07-09 – 2015-07-13 (×18): 1000 mg via ORAL
  Filled 2015-07-08 (×19): qty 2

## 2015-07-08 MED ORDER — SODIUM CHLORIDE 0.9 % IJ SOLN: 3.0000 mL | INTRAMUSCULAR | Status: DC | PRN

## 2015-07-08 MED ORDER — BISACODYL 10 MG RE SUPP
10.0000 mg | Freq: Every day | RECTAL | Status: DC
Start: 2015-07-09 — End: 2015-07-16
  Filled 2015-07-08: qty 1

## 2015-07-08 MED ORDER — ACETAMINOPHEN 160 MG/5ML PO SOLN
1000.0000 mg | Freq: Four times a day (QID) | ORAL | Status: DC
  Administered 2015-07-09: 1000 mg

## 2015-07-08 MED ORDER — THROMBIN 20000 UNITS EX SOLR
CUTANEOUS | Status: DC | PRN
  Administered 2015-07-08: 20000 [IU] via TOPICAL

## 2015-07-08 MED ORDER — ONDANSETRON HCL 4 MG/2ML IJ SOLN
4.0000 mg | Freq: Four times a day (QID) | INTRAMUSCULAR | Status: DC | PRN
  Administered 2015-07-10: 4 mg via INTRAVENOUS
  Filled 2015-07-08: qty 2

## 2015-07-08 MED ORDER — ATORVASTATIN CALCIUM 80 MG PO TABS
80.0000 mg | ORAL_TABLET | Freq: Every day | ORAL | Status: DC
  Administered 2015-07-09 – 2015-07-15 (×7): 80 mg via ORAL
  Filled 2015-07-08 (×8): qty 1

## 2015-07-08 MED ORDER — INSULIN ASPART 100 UNIT/ML ~~LOC~~ SOLN
0.0000 [IU] | SUBCUTANEOUS | Status: DC
  Administered 2015-07-09 (×2): 2 [IU] via SUBCUTANEOUS

## 2015-07-08 MED ORDER — FAMOTIDINE IN NACL 20-0.9 MG/50ML-% IV SOLN
20.0000 mg | Freq: Two times a day (BID) | INTRAVENOUS | Status: DC
  Administered 2015-07-08: 20 mg via INTRAVENOUS

## 2015-07-08 MED ORDER — ASPIRIN EC 325 MG PO TBEC
325.0000 mg | DELAYED_RELEASE_TABLET | Freq: Every day | ORAL | Status: DC
  Filled 2015-07-08: qty 1

## 2015-07-08 MED ORDER — PROPOFOL 10 MG/ML IV BOLUS
INTRAVENOUS | Status: DC | PRN
Start: 2015-07-08 — End: 2015-07-08
  Administered 2015-07-08: 120 mg via INTRAVENOUS

## 2015-07-08 MED ORDER — THROMBIN 20000 UNITS EX SOLR
CUTANEOUS | Status: AC
  Filled 2015-07-08: qty 20000

## 2015-07-08 MED ORDER — HEMOSTATIC AGENTS (NO CHARGE) OPTIME
TOPICAL | Status: DC | PRN
  Administered 2015-07-08: 1 via TOPICAL

## 2015-07-08 MED ORDER — THROMBIN 20000 UNITS EX SOLR
OROMUCOSAL | Status: DC | PRN
  Administered 2015-07-08 (×3): 4 mL via TOPICAL

## 2015-07-08 MED ORDER — TRAMADOL HCL 50 MG PO TABS
50.0000 mg | ORAL_TABLET | ORAL | Status: DC | PRN
  Administered 2015-07-09: 100 mg via ORAL
  Administered 2015-07-10: 50 mg via ORAL
  Administered 2015-07-10 – 2015-07-11 (×2): 100 mg via ORAL
  Administered 2015-07-11: 50 mg via ORAL
  Administered 2015-07-11: 100 mg via ORAL
  Administered 2015-07-11: 50 mg via ORAL
  Filled 2015-07-08 (×2): qty 2
  Filled 2015-07-08: qty 1
  Filled 2015-07-08: qty 2
  Filled 2015-07-08 (×2): qty 1
  Filled 2015-07-08: qty 2

## 2015-07-08 MED ORDER — LACTATED RINGERS IV SOLN
500.0000 mL | Freq: Once | INTRAVENOUS | Status: AC | PRN
  Administered 2015-07-08: 500 mL via INTRAVENOUS

## 2015-07-08 MED ORDER — LACTATED RINGERS IV SOLN
INTRAVENOUS | Status: DC | PRN
  Administered 2015-07-08 (×2): via INTRAVENOUS

## 2015-07-08 MED ORDER — CHLORHEXIDINE GLUCONATE 4 % EX LIQD: 30.0000 mL | CUTANEOUS | Status: DC

## 2015-07-08 MED ORDER — ACETAMINOPHEN 160 MG/5ML PO SOLN: 650.0000 mg | Freq: Once | ORAL | Status: AC

## 2015-07-08 MED ORDER — ROCURONIUM BROMIDE 50 MG/5ML IV SOLN
INTRAVENOUS | Status: AC
  Filled 2015-07-08: qty 3

## 2015-07-08 MED ORDER — MIDAZOLAM HCL 2 MG/2ML IJ SOLN
INTRAMUSCULAR | Status: AC
  Filled 2015-07-08: qty 4

## 2015-07-08 MED ORDER — DEXTROSE 5 % IV SOLN
1.5000 g | Freq: Two times a day (BID) | INTRAVENOUS | Status: AC
  Administered 2015-07-08 – 2015-07-10 (×4): 1.5 g via INTRAVENOUS
  Filled 2015-07-08 (×4): qty 1.5

## 2015-07-08 MED ORDER — ASPIRIN 81 MG PO CHEW: 324.0000 mg | CHEWABLE_TABLET | Freq: Every day | ORAL | Status: DC

## 2015-07-08 MED ORDER — SODIUM CHLORIDE 0.9 % IV SOLN
INTRAVENOUS | Status: DC
  Filled 2015-07-08 (×2): qty 2.5

## 2015-07-08 MED ORDER — LIDOCAINE HCL (CARDIAC) 20 MG/ML IV SOLN
INTRAVENOUS | Status: DC | PRN
  Administered 2015-07-08: 80 mg via INTRAVENOUS

## 2015-07-08 MED ORDER — SUCCINYLCHOLINE CHLORIDE 20 MG/ML IJ SOLN
INTRAMUSCULAR | Status: AC
  Filled 2015-07-08: qty 1

## 2015-07-08 MED ORDER — PHENYLEPHRINE HCL 10 MG/ML IJ SOLN
20.0000 mg | INTRAMUSCULAR | Status: DC | PRN
  Administered 2015-07-08: 5 ug/min via INTRAVENOUS

## 2015-07-08 MED ORDER — LACTATED RINGERS IV SOLN: INTRAVENOUS | Status: DC

## 2015-07-08 MED ORDER — 0.9 % SODIUM CHLORIDE (POUR BTL) OPTIME
TOPICAL | Status: DC | PRN
  Administered 2015-07-08: 5000 mL

## 2015-07-08 MED ORDER — HEPARIN SODIUM (PORCINE) 1000 UNIT/ML IJ SOLN
INTRAMUSCULAR | Status: DC | PRN
  Administered 2015-07-08: 23000 [IU] via INTRAVENOUS

## 2015-07-08 MED ORDER — ROCURONIUM BROMIDE 50 MG/5ML IV SOLN
INTRAVENOUS | Status: AC
  Filled 2015-07-08: qty 1

## 2015-07-08 MED ORDER — BISACODYL 5 MG PO TBEC
10.0000 mg | DELAYED_RELEASE_TABLET | Freq: Every day | ORAL | Status: DC
  Administered 2015-07-09 – 2015-07-15 (×5): 10 mg via ORAL
  Filled 2015-07-08 (×8): qty 2

## 2015-07-08 MED ORDER — PROTAMINE SULFATE 10 MG/ML IV SOLN
INTRAVENOUS | Status: DC | PRN
  Administered 2015-07-08: 50 mg via INTRAVENOUS
  Administered 2015-07-08: 25 mg via INTRAVENOUS
  Administered 2015-07-08: 10 mg via INTRAVENOUS
  Administered 2015-07-08: 15 mg via INTRAVENOUS
  Administered 2015-07-08: 50 mg via INTRAVENOUS

## 2015-07-08 MED ORDER — DEXTROSE 5 % IV SOLN
10.0000 mg | INTRAVENOUS | Status: DC | PRN
  Administered 2015-07-08: 10 ug/min via INTRAVENOUS

## 2015-07-08 MED ORDER — POTASSIUM CHLORIDE 10 MEQ/50ML IV SOLN
10.0000 meq | INTRAVENOUS | Status: AC
  Administered 2015-07-08 (×3): 10 meq via INTRAVENOUS

## 2015-07-08 MED ORDER — FENTANYL CITRATE (PF) 100 MCG/2ML IJ SOLN
INTRAMUSCULAR | Status: DC | PRN
  Administered 2015-07-08: 50 ug via INTRAVENOUS
  Administered 2015-07-08 (×2): 250 ug via INTRAVENOUS
  Administered 2015-07-08 (×2): 100 ug via INTRAVENOUS
  Administered 2015-07-08: 150 ug via INTRAVENOUS
  Administered 2015-07-08: 100 ug via INTRAVENOUS
  Administered 2015-07-08: 250 ug via INTRAVENOUS

## 2015-07-08 MED ORDER — SODIUM CHLORIDE 0.9 % IV SOLN
INTRAVENOUS | Status: DC
  Administered 2015-07-08: 20 mL via INTRAVENOUS

## 2015-07-08 MED ORDER — MIDAZOLAM HCL 10 MG/2ML IJ SOLN
INTRAMUSCULAR | Status: AC
  Filled 2015-07-08: qty 4

## 2015-07-08 MED ORDER — PROTAMINE SULFATE 10 MG/ML IV SOLN
INTRAVENOUS | Status: AC
  Filled 2015-07-08: qty 25

## 2015-07-08 MED ORDER — VANCOMYCIN HCL IN DEXTROSE 1-5 GM/200ML-% IV SOLN
1000.0000 mg | Freq: Once | INTRAVENOUS | Status: AC
  Administered 2015-07-08: 1000 mg via INTRAVENOUS
  Filled 2015-07-08: qty 200

## 2015-07-08 MED ORDER — SODIUM CHLORIDE 0.45 % IV SOLN: INTRAVENOUS | Status: DC | PRN

## 2015-07-08 MED ORDER — HEPARIN SODIUM (PORCINE) 1000 UNIT/ML IJ SOLN
INTRAMUSCULAR | Status: AC
  Filled 2015-07-08: qty 1

## 2015-07-08 MED ORDER — MAGNESIUM SULFATE 4 GM/100ML IV SOLN
4.0000 g | Freq: Once | INTRAVENOUS | Status: AC
  Administered 2015-07-08: 4 g via INTRAVENOUS
  Filled 2015-07-08: qty 100

## 2015-07-08 MED ORDER — DOCUSATE SODIUM 100 MG PO CAPS
200.0000 mg | ORAL_CAPSULE | Freq: Every day | ORAL | Status: DC
  Administered 2015-07-09 – 2015-07-15 (×6): 200 mg via ORAL
  Filled 2015-07-08 (×8): qty 2

## 2015-07-08 MED ORDER — INSULIN REGULAR BOLUS VIA INFUSION
0.0000 [IU] | Freq: Three times a day (TID) | INTRAVENOUS | Status: DC
  Filled 2015-07-08: qty 10

## 2015-07-08 MED ORDER — PROPOFOL 10 MG/ML IV BOLUS
INTRAVENOUS | Status: AC
  Filled 2015-07-08: qty 20

## 2015-07-08 MED ORDER — SODIUM CHLORIDE 0.9 % IV SOLN: 250.0000 mL | INTRAVENOUS | Status: DC

## 2015-07-08 MED ORDER — OXYCODONE HCL 5 MG PO TABS
5.0000 mg | ORAL_TABLET | ORAL | Status: DC | PRN
  Administered 2015-07-09 – 2015-07-14 (×2): 10 mg via ORAL
  Filled 2015-07-08 (×2): qty 2

## 2015-07-08 MED ORDER — MIDAZOLAM HCL 5 MG/5ML IJ SOLN
INTRAMUSCULAR | Status: DC | PRN
  Administered 2015-07-08: 3 mg via INTRAVENOUS
  Administered 2015-07-08: 2 mg via INTRAVENOUS
  Administered 2015-07-08: 3 mg via INTRAVENOUS
  Administered 2015-07-08 (×2): 2 mg via INTRAVENOUS

## 2015-07-08 MED ORDER — SODIUM CHLORIDE 0.9 % IJ SOLN
3.0000 mL | Freq: Two times a day (BID) | INTRAMUSCULAR | Status: DC
  Administered 2015-07-09 – 2015-07-11 (×3): 3 mL via INTRAVENOUS

## 2015-07-08 MED ORDER — ACETAMINOPHEN 650 MG RE SUPP
650.0000 mg | Freq: Once | RECTAL | Status: AC
Start: 2015-07-08 — End: 2015-07-08
  Administered 2015-07-08: 650 mg via RECTAL

## 2015-07-08 MED ORDER — NITROGLYCERIN IN D5W 200-5 MCG/ML-% IV SOLN: 0.0000 ug/min | INTRAVENOUS | Status: DC

## 2015-07-08 MED ORDER — MORPHINE SULFATE (PF) 2 MG/ML IV SOLN
2.0000 mg | INTRAVENOUS | Status: DC | PRN
  Administered 2015-07-09: 4 mg via INTRAVENOUS
  Administered 2015-07-09 – 2015-07-10 (×4): 2 mg via INTRAVENOUS
  Filled 2015-07-08 (×3): qty 1
  Filled 2015-07-08: qty 2
  Filled 2015-07-08 (×2): qty 1

## 2015-07-08 MED ORDER — METOPROLOL TARTRATE 12.5 MG HALF TABLET
12.5000 mg | ORAL_TABLET | Freq: Two times a day (BID) | ORAL | Status: DC
  Administered 2015-07-09 – 2015-07-12 (×6): 12.5 mg via ORAL
  Filled 2015-07-08 (×10): qty 1

## 2015-07-08 MED ORDER — METOPROLOL TARTRATE 1 MG/ML IV SOLN
2.5000 mg | INTRAVENOUS | Status: DC | PRN
  Administered 2015-07-12: 5 mg via INTRAVENOUS
  Filled 2015-07-08: qty 5

## 2015-07-08 MED ORDER — METOPROLOL TARTRATE 25 MG/10 ML ORAL SUSPENSION
12.5000 mg | Freq: Two times a day (BID) | ORAL | Status: DC
  Filled 2015-07-08 (×3): qty 5
  Filled 2015-07-08: qty 10
  Filled 2015-07-08 (×4): qty 5

## 2015-07-08 MED ORDER — ALBUMIN HUMAN 5 % IV SOLN
250.0000 mL | INTRAVENOUS | Status: AC | PRN
  Administered 2015-07-08 (×3): 250 mL via INTRAVENOUS
  Filled 2015-07-08: qty 250

## 2015-07-08 MED ORDER — MIDAZOLAM HCL 2 MG/2ML IJ SOLN: 2.0000 mg | INTRAMUSCULAR | Status: DC | PRN

## 2015-07-08 MED ORDER — ALBUMIN HUMAN 5 % IV SOLN
INTRAVENOUS | Status: DC | PRN
  Administered 2015-07-08: 12:00:00 via INTRAVENOUS

## 2015-07-08 SURGICAL SUPPLY — 106 items
BAG DECANTER FOR FLEXI CONT (MISCELLANEOUS) ×3 IMPLANT
BANDAGE ELASTIC 4 VELCRO ST LF (GAUZE/BANDAGES/DRESSINGS) ×3 IMPLANT
BANDAGE ELASTIC 6 VELCRO ST LF (GAUZE/BANDAGES/DRESSINGS) ×3 IMPLANT
BASKET HEART (ORDER IN 25'S) (MISCELLANEOUS) ×1
BASKET HEART (ORDER IN 25S) (MISCELLANEOUS) ×2 IMPLANT
BLADE STERNUM SYSTEM 6 (BLADE) ×3 IMPLANT
BNDG GAUZE ELAST 4 BULKY (GAUZE/BANDAGES/DRESSINGS) ×3 IMPLANT
CANISTER SUCTION 2500CC (MISCELLANEOUS) ×3 IMPLANT
CATH ROBINSON RED A/P 18FR (CATHETERS) ×6 IMPLANT
CATH THORACIC 28FR (CATHETERS) ×3 IMPLANT
CATH THORACIC 36FR (CATHETERS) ×3 IMPLANT
CATH THORACIC 36FR RT ANG (CATHETERS) ×3 IMPLANT
CLIP TI MEDIUM 24 (CLIP) IMPLANT
CLIP TI WIDE RED SMALL 24 (CLIP) ×3 IMPLANT
COVER SURGICAL LIGHT HANDLE (MISCELLANEOUS) ×3 IMPLANT
CRADLE DONUT ADULT HEAD (MISCELLANEOUS) ×3 IMPLANT
DRAPE CARDIOVASCULAR INCISE (DRAPES) ×1
DRAPE SLUSH/WARMER DISC (DRAPES) ×3 IMPLANT
DRAPE SRG 135X102X78XABS (DRAPES) ×2 IMPLANT
DRSG COVADERM 4X14 (GAUZE/BANDAGES/DRESSINGS) ×3 IMPLANT
ELECT CAUTERY BLADE 6.4 (BLADE) ×3 IMPLANT
ELECT REM PT RETURN 9FT ADLT (ELECTROSURGICAL) ×6
ELECTRODE REM PT RTRN 9FT ADLT (ELECTROSURGICAL) ×4 IMPLANT
GAUZE SPONGE 4X4 12PLY STRL (GAUZE/BANDAGES/DRESSINGS) ×6 IMPLANT
GLOVE BIO SURGEON STRL SZ 6 (GLOVE) ×6 IMPLANT
GLOVE BIO SURGEON STRL SZ 6.5 (GLOVE) IMPLANT
GLOVE BIO SURGEON STRL SZ7 (GLOVE) IMPLANT
GLOVE BIO SURGEON STRL SZ7.5 (GLOVE) ×6 IMPLANT
GLOVE BIOGEL PI IND STRL 6 (GLOVE) ×6 IMPLANT
GLOVE BIOGEL PI IND STRL 6.5 (GLOVE) ×2 IMPLANT
GLOVE BIOGEL PI IND STRL 7.0 (GLOVE) ×4 IMPLANT
GLOVE BIOGEL PI INDICATOR 6 (GLOVE) ×3
GLOVE BIOGEL PI INDICATOR 6.5 (GLOVE) ×1
GLOVE BIOGEL PI INDICATOR 7.0 (GLOVE) ×2
GLOVE EUDERMIC 7 POWDERFREE (GLOVE) ×9 IMPLANT
GLOVE ORTHO TXT STRL SZ7.5 (GLOVE) IMPLANT
GOWN STRL REUS W/ TWL LRG LVL3 (GOWN DISPOSABLE) ×12 IMPLANT
GOWN STRL REUS W/ TWL XL LVL3 (GOWN DISPOSABLE) ×2 IMPLANT
GOWN STRL REUS W/TWL LRG LVL3 (GOWN DISPOSABLE) ×6
GOWN STRL REUS W/TWL XL LVL3 (GOWN DISPOSABLE) ×1
HEMOSTAT POWDER SURGIFOAM 1G (HEMOSTASIS) ×9 IMPLANT
HEMOSTAT SURGICEL 2X14 (HEMOSTASIS) ×3 IMPLANT
INSERT FOGARTY 61MM (MISCELLANEOUS) IMPLANT
INSERT FOGARTY XLG (MISCELLANEOUS) IMPLANT
KIT BASIN OR (CUSTOM PROCEDURE TRAY) ×3 IMPLANT
KIT CATH CPB BARTLE (MISCELLANEOUS) ×3 IMPLANT
KIT ROOM TURNOVER OR (KITS) ×3 IMPLANT
KIT SUCTION CATH 14FR (SUCTIONS) ×3 IMPLANT
KIT VASOVIEW W/TROCAR VH 2000 (KITS) ×3 IMPLANT
NS IRRIG 1000ML POUR BTL (IV SOLUTION) ×15 IMPLANT
PACK OPEN HEART (CUSTOM PROCEDURE TRAY) ×3 IMPLANT
PAD ARMBOARD 7.5X6 YLW CONV (MISCELLANEOUS) ×12 IMPLANT
PAD ELECT DEFIB RADIOL ZOLL (MISCELLANEOUS) ×3 IMPLANT
PENCIL BUTTON HOLSTER BLD 10FT (ELECTRODE) ×3 IMPLANT
PUNCH AORTIC ROTATE 4.0MM (MISCELLANEOUS) IMPLANT
PUNCH AORTIC ROTATE 4.5MM 8IN (MISCELLANEOUS) ×3 IMPLANT
PUNCH AORTIC ROTATE 5MM 8IN (MISCELLANEOUS) IMPLANT
SET CARDIOPLEGIA MPS 5001102 (MISCELLANEOUS) ×3 IMPLANT
SPONGE INTESTINAL PEANUT (DISPOSABLE) IMPLANT
SPONGE LAP 18X18 X RAY DECT (DISPOSABLE) IMPLANT
SPONGE LAP 4X18 X RAY DECT (DISPOSABLE) IMPLANT
SUT BONE WAX W31G (SUTURE) ×3 IMPLANT
SUT ETHIBOND 2 0 SH (SUTURE) ×4
SUT ETHIBOND 2 0 SH 36X2 (SUTURE) ×8 IMPLANT
SUT MNCRL AB 4-0 PS2 18 (SUTURE) IMPLANT
SUT PROLENE 3 0 SH DA (SUTURE) IMPLANT
SUT PROLENE 3 0 SH1 36 (SUTURE) ×3 IMPLANT
SUT PROLENE 4 0 RB 1 (SUTURE) ×1
SUT PROLENE 4 0 SH DA (SUTURE) IMPLANT
SUT PROLENE 4-0 RB1 .5 CRCL 36 (SUTURE) ×2 IMPLANT
SUT PROLENE 5 0 C 1 36 (SUTURE) ×9 IMPLANT
SUT PROLENE 6 0 C 1 30 (SUTURE) ×15 IMPLANT
SUT PROLENE 7 0 BV 1 (SUTURE) ×3 IMPLANT
SUT PROLENE 7 0 BV1 MDA (SUTURE) ×6 IMPLANT
SUT PROLENE 8 0 BV175 6 (SUTURE) ×6 IMPLANT
SUT SILK  1 MH (SUTURE) ×3
SUT SILK 1 MH (SUTURE) ×6 IMPLANT
SUT SILK 1 TIES 10X30 (SUTURE) ×3 IMPLANT
SUT SILK 2 0 SH CR/8 (SUTURE) ×6 IMPLANT
SUT SILK 2 0 TIES 10X30 (SUTURE) ×3 IMPLANT
SUT SILK 2 0 TIES 17X18 (SUTURE) ×1
SUT SILK 2-0 18XBRD TIE BLK (SUTURE) ×2 IMPLANT
SUT SILK 3 0 SH CR/8 (SUTURE) ×3 IMPLANT
SUT SILK 4 0 TIE 10X30 (SUTURE) ×6 IMPLANT
SUT STEEL STERNAL CCS#1 18IN (SUTURE) ×6 IMPLANT
SUT STEEL SZ 6 DBL 3X14 BALL (SUTURE) IMPLANT
SUT TEM PAC WIRE 2 0 SH (SUTURE) ×12 IMPLANT
SUT VIC AB 1 CTX 27 (SUTURE) ×3 IMPLANT
SUT VIC AB 1 CTX 36 (SUTURE) ×2
SUT VIC AB 1 CTX36XBRD ANBCTR (SUTURE) ×4 IMPLANT
SUT VIC AB 2-0 CT1 27 (SUTURE) ×1
SUT VIC AB 2-0 CT1 TAPERPNT 27 (SUTURE) ×2 IMPLANT
SUT VIC AB 2-0 CTX 27 (SUTURE) ×6 IMPLANT
SUT VIC AB 3-0 SH 27 (SUTURE)
SUT VIC AB 3-0 SH 27X BRD (SUTURE) IMPLANT
SUT VIC AB 3-0 X1 27 (SUTURE) ×9 IMPLANT
SUT VICRYL 4-0 PS2 18IN ABS (SUTURE) IMPLANT
SUTURE E-PAK OPEN HEART (SUTURE) IMPLANT
SYSTEM SAHARA CHEST DRAIN ATS (WOUND CARE) ×3 IMPLANT
TAPE CLOTH SURG 4X10 WHT LF (GAUZE/BANDAGES/DRESSINGS) ×3 IMPLANT
TOWEL OR 17X24 6PK STRL BLUE (TOWEL DISPOSABLE) ×3 IMPLANT
TOWEL OR 17X26 10 PK STRL BLUE (TOWEL DISPOSABLE) ×3 IMPLANT
TRAY FOLEY IC TEMP SENS 16FR (CATHETERS) ×3 IMPLANT
TUBING INSUFFLATION (TUBING) ×3 IMPLANT
UNDERPAD 30X30 INCONTINENT (UNDERPADS AND DIAPERS) ×3 IMPLANT
WATER STERILE IRR 1000ML POUR (IV SOLUTION) ×6 IMPLANT

## 2015-07-08 NOTE — OR Nursing (Signed)
Forty-five minute call to SICU charge nurse at 1211.

## 2015-07-08 NOTE — Progress Notes (Signed)
CT surgery p.m. Rounds  Doing well status post CABG 5 Extubated Stable hemodynamics Good urine output Minimal chest tube output

## 2015-07-08 NOTE — Interval H&P Note (Signed)
History and Physical Interval Note:  07/08/2015 5:56 AM  Geoffrey [name]  has presented today for surgery, with the diagnosis of CAD  The various methods of treatment have been discussed with the patient and family. After consideration of risks, benefits and other options for treatment, the patient has consented to  Procedure(s): CORONARY ARTERY BYPASS GRAFTING (CABG) (N/A) TRANSESOPHAGEAL ECHOCARDIOGRAM (TEE) (N/A) as a surgical intervention .  The patient's history has been reviewed, patient examined, no change in status, stable for surgery.  I have reviewed the patient's chart and labs.  Questions were answered to the patient's satisfaction.     Gaye Pollack

## 2015-07-08 NOTE — OR Nursing (Signed)
Twenty minute call to SICU charge nurse at 1245.

## 2015-07-08 NOTE — Op Note (Signed)
CARDIOVASCULAR SURGERY OPERATIVE NOTE  07/08/2015  Surgeon:  Gaye Pollack, MD  First Assistant: Jadene Pierini,  PA-C   Preoperative Diagnosis:  Severe multi-vessel coronary artery disease, s/p PCI/DES of RCA   Postoperative Diagnosis:  Same   Procedure:  1. Median Sternotomy 2. Extracorporeal circulation 3.   Coronary artery bypass grafting x 5   Left internal mammary graft to the LAD  SVG to diagonal  SVG to OM  Sequential SVG to PDA ad PL 4.   Endoscopic vein harvest from the right leg   Anesthesia:  General Endotracheal   Clinical History/Surgical Indication:   The patient is a 74 year old gentleman with a history of hypertension and hyperlipidemia who was traveling in [place] and suffered and inferior STEMI on 05/23/2015. He developed substernal chest discomfort described as pressure radiating to both arms and associated with diaphoresis and shortness of breath. Cath showed an occluded RCA as well as severe multi-vessel coronary artery disease. He underwent successful PCI of the RCA with 2 drug eluting stents (Synergy 3.5 x 38 mm and 3.5 x 20 mm). The peak troponin I that was measured was 3.26. He had an uneventful course and was discharged on ASA and Plavix. He followed up with Dr. Pleas Koch and was referred to me for consideration of CABG. He says that he has felt well since discharge with no chest discomfort or shortness of breath.  He has severe 3-vessel coronary disease presenting with an acute inferior STEMI due to occlusion of the proximal RCA. This was opened successfully with 2 DES with mild elevation of the troponin and normal LV function. He is asymptomatic at this time on ASA and Plavix. I think CABG is indicated given the severity of his other disease, particularly the LAD/diag and OM. He would need to be on DAPT for at least a month before stopping the Plavix for  surgery and then would need these restarted afterwards. I discussed the operative procedure with the patient and family including alternatives, benefits and risks; including but not limited to bleeding, blood transfusion, infection, stroke, myocardial infarction, graft failure, heart block requiring a permanent pacemaker, organ dysfunction, and death. Cristopher Estimable understands and agrees to proceed  Preparation:  The patient was seen in the preoperative holding area and the correct patient, correct operation were confirmed with the patient after reviewing the medical record and catheterization. The consent was signed by me. Preoperative antibiotics were given. A pulmonary arterial line and radial arterial line were placed by the anesthesia team. The patient was taken back to the operating room and positioned supine on the operating room table. After being placed under general endotracheal anesthesia by the anesthesia team a foley catheter was placed. The neck, chest, abdomen, and both legs were prepped with betadine soap and solution and draped in the usual sterile manner. A surgical time-out was taken and the correct patient and operative procedure were confirmed with the nursing and anesthesia staff.   Cardiopulmonary Bypass:  A median sternotomy was performed. The pericardium was opened in the midline. Right ventricular function appeared normal. The ascending aorta was of normal size and had no palpable plaque. There were no contraindications to aortic cannulation or cross-clamping. The patient was fully systemically heparinized and the ACT was maintained > 400 sec. The proximal aortic arch was cannulated with a 20 F aortic cannula for arterial inflow. Venous cannulation was performed via the right atrial appendage using a two-staged venous cannula. An antegrade cardioplegia/vent cannula was inserted into the  mid-ascending aorta. Aortic occlusion was performed with a single cross-clamp. Systemic  cooling to 32 degrees Centigrade and topical cooling of the heart with iced saline were used. Hyperkalemic antegrade cold blood cardioplegia was used to induce diastolic arrest and was then given at about 20 minute intervals throughout the period of arrest to maintain myocardial temperature at or below 10 degrees centigrade. A temperature probe was inserted into the interventricular septum and an insulating pad was placed in the pericardium.   Left internal mammary harvest:  The left side of the sternum was retracted using the Rultract retractor. The left internal mammary artery was harvested as a pedicle graft. All side branches were clipped. It was a medium-sized vessel of good quality with excellent blood flow. It was ligated distally and divided. It was sprayed with topical papaverine solution to prevent vasospasm.   Endoscopic vein harvest:  The right greater saphenous vein was harvested endoscopically through a 2 cm incision medial to the right knee. It was harvested from the upper thigh to below the knee. It was a medium-sized vein of good quality. The side branches were all ligated with 4-0 silk ties.    Coronary arteries:  The coronary arteries were examined.   LAD:  Large vessel that was diffusely diseased out into the apex. The diagonal was moderate sized with two sub-branches. The medial sub-branch had a tight stenosis on cath.  LCX:  OM was small but graftable with no distal disease  RCA:  PDA and PL were diffusely diseased but graftable distally beyond all of the stenoses. They were small distally.   Grafts:  1. LIMA to the LAD: 2.0 mm. It was sewn end to side using 8-0 prolene continuous suture. 2. SVG to diagonal (medial branch):  1.5 mm. It was sewn end to side using 7-0 prolene continuous suture. 3. SVG to OM:  1.5 mm. It was sewn end to side using 7-0 prolene continuous suture. 4. Sequential SVG to PDA:  1.5 mm. It was sewn sequential side to side using 7-0 prolene  continuous suture. 5. Sequential SVG to PL:  1.5 mm. It was sew sequential end to side using 7-0 prolene continuous suture.  The proximal vein graft anastomoses were performed to the mid-ascending aorta using continuous 6-0 prolene suture. Graft markers were placed around the proximal anastomoses.   Completion:  The patient was rewarmed to 37 degrees Centigrade. The clamp was removed from the LIMA pedicle and there was rapid warming of the septum and return of ventricular fibrillation. The crossclamp was removed with a time of 91 minutes. There was spontaneous return of sinus rhythm. The distal and proximal anastomoses were checked for hemostasis. The position of the grafts was satisfactory. Two temporary epicardial pacing wires were placed on the right atrium and two on the right ventricle. The patient was weaned from CPB without difficulty on no inotropes. CPB time was 112 minutes. Cardiac output was 5 LPM. Heparin was fully reversed with protamine and the aortic and venous cannulas removed. Hemostasis was achieved. Mediastinal and left pleural drainage tubes were placed. The sternum was closed with  #6 stainless steel wires. The fascia was closed with continuous # 1 vicryl suture. The subcutaneous tissue was closed with 2-0 vicryl continuous suture. The skin was closed with 3-0 vicryl subcuticular suture. All sponge, needle, and instrument counts were reported correct at the end of the case. Dry sterile dressings were placed over the incisions and around the chest tubes which were connected to pleurevac suction. The  patient was then transported to the surgical intensive care unit in critical but stable condition.

## 2015-07-08 NOTE — Procedures (Signed)
Extubation Procedure Note  Patient Details:   Name: Geoffrey [name] DOB: October 27, 1940 MRN: [id number]   Airway Documentation:     Evaluation  O2 sats: stable throughout Complications: No apparent complications Patient did tolerate procedure well. Bilateral Breath Sounds: Clear, Diminished   Yes   Pt. Was extubated to a 4L [place] without any complications, dyspnea or stridor noted. Pt. Achieved a goal of -22 on NIF & 1.4L on VC. Pt. Was instructed on IS x 5, highest goal reached was 1,08mL.   Kiwanna Spraker, Eddie North 07/08/2015, 6:40, PM

## 2015-07-08 NOTE — Brief Op Note (Signed)
07/08/2015  11:25 AM      [address]       [place] [address]             [phone]     07/08/2015  11:26 AM  PATIENT:  Geoffrey [name]  74 y.o. male  PRE-OPERATIVE DIAGNOSIS:  CAD  POST-OPERATIVE DIAGNOSIS:  CAD  PROCEDURE:  Procedure(s): CORONARY ARTERY BYPASS GRAFTING (CABG), ON PUMP, TIMES FIVE, USING LEFT INTERNAL MAMMARY ARTERY, RIGHT GREATER SAPHENOUS VEIN HARVESTED ENDOSCOPICALLY(EVH)   ( LIMA-LAD; SEQ SVG-PD-PL; SVG-DIAG; SVG-OM) TRANSESOPHAGEAL ECHOCARDIOGRAM (TEE)  SURGEON:  Surgeon(s): Gaye Pollack, MD  PHYSICIAN ASSISTANT: WAYNE GOLD PA-C  ANESTHESIA:   general  PATIENT CONDITION:  ICU - intubated and hemodynamically stable.  PRE-OPERATIVE WEIGHT: 75kg  EBL/TRANSFUSION: SEE ANEST/PERFUSION RECORDS  COMPLICATIONS: NO KNOWN

## 2015-07-08 NOTE — Anesthesia Procedure Notes (Addendum)
Procedure Name: Intubation Date/Time: 07/08/2015 7:47 AM Performed by: Lavell Luster Pre-anesthesia Checklist: Patient identified, Emergency Drugs available, Suction available and Patient being monitored Patient Re-evaluated:Patient Re-evaluated prior to inductionOxygen Delivery Method: Circle system utilized Preoxygenation: Pre-oxygenation with 100% oxygen Intubation Type: IV induction Ventilation: Mask ventilation without difficulty Laryngoscope Size: Mac and 4 Grade View: Grade II Tube type: Subglottic suction tube Tube size: 8.0 mm Number of attempts: 1 Airway Equipment and Method: Stylet Placement Confirmation: ETT inserted through vocal cords under direct vision,  positive ETCO2 and breath sounds checked- equal and bilateral Secured at: 23 cm Tube secured with: Tape Dental Injury: Teeth and Oropharynx as per pre-operative assessment  Comments: Easy atraumatic induction and intubation with MAC 4 blade. Dr Oletta Lamas verified placement.  Henderson Cloud, CRNA

## 2015-07-08 NOTE — Transfer of Care (Signed)
Immediate Anesthesia Transfer of Care Note  Patient: Geoffrey [name]  Procedure(s) Performed: Procedure(s): CORONARY ARTERY BYPASS GRAFTING (CABG), ON PUMP, TIMES FIVE, USING LEFT INTERNAL MAMMARY ARTERY, RIGHT GREATER SAPHENOUS VEIN HARVESTED ENDOSCOPICALLY (N/A) TRANSESOPHAGEAL ECHOCARDIOGRAM (TEE) (N/A)  Patient Location: SICU  Anesthesia Type:General  Level of Consciousness: Patient remains intubated per anesthesia plan  Airway & Oxygen Therapy: Patient remains intubated per anesthesia plan  Post-op Assessment: Post -op Vital signs reviewed and stable  Post vital signs: stable  Last Vitals:  Filed Vitals:   07/08/15 0610  BP: 154/69  Pulse: 65  Temp: 37 C  Resp: 20    Complications: No apparent anesthesia complications

## 2015-07-08 NOTE — Progress Notes (Signed)
  Echocardiogram Echocardiogram Transesophageal has been performed.  Geoffrey [name] 07/08/2015, 9:31 AM

## 2015-07-08 NOTE — Anesthesia Preprocedure Evaluation (Addendum)
Anesthesia Evaluation  Patient identified by MRN, date of birth, ID band Patient awake    Reviewed: Allergy & Precautions, NPO status , Patient's Chart, lab work & pertinent test results  History of Anesthesia Complications Negative for: history of anesthetic complications  Airway Mallampati: II  TM Distance: >3 FB Neck ROM: Full    Dental  (+) Teeth Intact, Partial Upper   Pulmonary neg shortness of breath, neg sleep apnea, pneumonia, neg COPD, neg recent URI, former smoker, neg PE   breath sounds clear to auscultation       Cardiovascular hypertension, Pt. on medications + CAD and + Past MI  (-) CHF (-) dysrhythmias  Rhythm:Regular  MI 8/28 with RCA DES x2, additional multivessel disease.    Neuro/Psych negative neurological ROS     GI/Hepatic negative GI ROS, Neg liver ROS,   Endo/Other  negative endocrine ROS  Renal/GU negative Renal ROS     Musculoskeletal  (+) Arthritis ,   Abdominal   Peds  Hematology   Anesthesia Other Findings Left ventricle: The cavity size was normal. Wall thickness was normal. Systolic function was normal. The estimated ejection fraction was in the range of 55% to 60%. There is hypokinesis of the basalinferior myocardium. Doppler parameters are consistent with abnormal left ventricular relaxation (grade 1 diastolic dysfunction). - Aortic valve: Mildly calcified annulus. Trileaflet; mildly calcified leaflets. Cusp separation was mildly reduced. There was no significant regurgitation. Peak gradient (S): 9 mm Hg. Peak velocity ratio of LVOT to aortic valve: 0.84. Valve area (Vmax): 3.18 cm^2. - Mitral valve: There was trivial regurgitation. - Right atrium: Central venous pressure (est): 3 mm Hg. - Atrial septum: No defect or patent foramen ovale was identified. - Tricuspid valve: There was trivial regurgitation. - Pulmonary arteries: Systolic pressure could not be  accurately estimated. - Pericardium, extracardiac: There was no pericardial effusion.   Reproductive/Obstetrics                          Anesthesia Physical Anesthesia Plan  ASA: IV  Anesthesia Plan: General   Post-op Pain Management:    Induction: Intravenous  Airway Management Planned: Oral ETT  Additional Equipment: Arterial line, CVP, TEE, PA Cath and Ultrasound Guidance Line Placement  Intra-op Plan:   Post-operative Plan: Post-operative intubation/ventilation  Informed Consent: I have reviewed the patients History and Physical, chart, labs and discussed the procedure including the risks, benefits and alternatives for the proposed anesthesia with the patient or authorized representative who has indicated his/her understanding and acceptance.   Dental advisory given  Plan Discussed with: CRNA and Surgeon  Anesthesia Plan Comments:         Anesthesia Quick Evaluation

## 2015-07-09 ENCOUNTER — Encounter (HOSPITAL_COMMUNITY): Payer: Self-pay | Admitting: Surgery

## 2015-07-09 ENCOUNTER — Inpatient Hospital Stay (HOSPITAL_COMMUNITY): Payer: Commercial Managed Care - HMO

## 2015-07-09 LAB — GLUCOSE, CAPILLARY
GLUCOSE-CAPILLARY: 139 mg/dL — AB (ref 65–99)
GLUCOSE-CAPILLARY: 116 mg/dL — AB (ref 65–99)
GLUCOSE-CAPILLARY: 118 mg/dL — AB (ref 65–99)
GLUCOSE-CAPILLARY: 150 mg/dL — AB (ref 65–99)
Glucose-Capillary: 86 mg/dL (ref 65–99)
Glucose-Capillary: 134 mg/dL — ABNORMAL HIGH (ref 65–99)

## 2015-07-09 LAB — CBC
HCT: 31.3 % — ABNORMAL LOW (ref 39.0–52.0)
HCT: 30 % — ABNORMAL LOW (ref 39.0–52.0)
Hemoglobin: 10.6 g/dL — ABNORMAL LOW (ref 13.0–17.0)
Hemoglobin: 9.9 g/dL — ABNORMAL LOW (ref 13.0–17.0)
MCH: 30.1 pg (ref 26.0–34.0)
MCH: 29.3 pg (ref 26.0–34.0)
MCHC: 33.9 g/dL (ref 30.0–36.0)
MCHC: 33 g/dL (ref 30.0–36.0)
MCV: 88.9 fL (ref 78.0–100.0)
MCV: 88.8 fL (ref 78.0–100.0)
PLATELETS: 166 10*3/uL (ref 150–400)
PLATELETS: 148 10*3/uL — AB (ref 150–400)
RBC: 3.52 MIL/uL — AB (ref 4.22–5.81)
RBC: 3.38 MIL/uL — ABNORMAL LOW (ref 4.22–5.81)
RDW: 13.7 % (ref 11.5–15.5)
RDW: 13.8 % (ref 11.5–15.5)
WBC: 15.6 10*3/uL — ABNORMAL HIGH (ref 4.0–10.5)
WBC: 11.7 10*3/uL — ABNORMAL HIGH (ref 4.0–10.5)

## 2015-07-09 LAB — POCT I-STAT, CHEM 8
BUN: 9 mg/dL (ref 6–20)
CALCIUM ION: 1.15 mmol/L (ref 1.13–1.30)
CHLORIDE: 99 mmol/L — AB (ref 101–111)
CREATININE: 0.6 mg/dL — AB (ref 0.61–1.24)
GLUCOSE: 148 mg/dL — AB (ref 65–99)
HCT: 29 % — ABNORMAL LOW (ref 39.0–52.0)
Hemoglobin: 9.9 g/dL — ABNORMAL LOW (ref 13.0–17.0)
POTASSIUM: 3.8 mmol/L (ref 3.5–5.1)
Sodium: 135 mmol/L (ref 135–145)
TCO2: 22 mmol/L (ref 0–100)

## 2015-07-09 LAB — MAGNESIUM
MAGNESIUM: 2.6 mg/dL — AB (ref 1.7–2.4)
Magnesium: 2.5 mg/dL — ABNORMAL HIGH (ref 1.7–2.4)

## 2015-07-09 LAB — BASIC METABOLIC PANEL
Anion gap: 7 (ref 5–15)
BUN: 10 mg/dL (ref 6–20)
CALCIUM: 7.6 mg/dL — AB (ref 8.9–10.3)
CO2: 24 mmol/L (ref 22–32)
CREATININE: 0.6 mg/dL — AB (ref 0.61–1.24)
Chloride: 106 mmol/L (ref 101–111)
GFR calc Af Amer: >60 mL/min (ref >60)
GLUCOSE: 145 mg/dL — AB (ref 65–99)
POTASSIUM: 4 mmol/L (ref 3.5–5.1)
SODIUM: 137 mmol/L (ref 135–145)

## 2015-07-09 LAB — CREATININE, SERUM
CREATININE: 0.58 mg/dL — AB (ref 0.61–1.24)
GFR calc Af Amer: >60 mL/min (ref >60)
GFR calc non Af Amer: >60 mL/min (ref >60)

## 2015-07-09 MED ORDER — CLOPIDOGREL BISULFATE 75 MG PO TABS
75.0000 mg | ORAL_TABLET | Freq: Every day | ORAL | Status: DC
  Administered 2015-07-09 – 2015-07-16 (×8): 75 mg via ORAL
  Filled 2015-07-09 (×8): qty 1

## 2015-07-09 MED ORDER — ASPIRIN 81 MG PO CHEW
81.0000 mg | CHEWABLE_TABLET | Freq: Every day | ORAL | Status: DC
  Administered 2015-07-15: 81 mg
  Filled 2015-07-09: qty 1

## 2015-07-09 MED ORDER — INSULIN ASPART 100 UNIT/ML ~~LOC~~ SOLN
0.0000 [IU] | SUBCUTANEOUS | Status: DC
  Administered 2015-07-09 – 2015-07-11 (×6): 2 [IU] via SUBCUTANEOUS

## 2015-07-09 MED ORDER — AMIODARONE HCL IN DEXTROSE 360-4.14 MG/200ML-% IV SOLN
60.0000 mg/h | INTRAVENOUS | Status: DC
Start: 2015-07-09 — End: 2015-07-09
  Administered 2015-07-09: 60 mg/h via INTRAVENOUS

## 2015-07-09 MED ORDER — AMIODARONE LOAD VIA INFUSION
150.0000 mg | Freq: Once | INTRAVENOUS | Status: AC
  Administered 2015-07-09: 150 mg via INTRAVENOUS
  Filled 2015-07-09: qty 83.34

## 2015-07-09 MED ORDER — ASPIRIN EC 81 MG PO TBEC
81.0000 mg | DELAYED_RELEASE_TABLET | Freq: Every day | ORAL | Status: DC
  Administered 2015-07-09 – 2015-07-14 (×6): 81 mg via ORAL
  Filled 2015-07-09 (×7): qty 1

## 2015-07-09 MED ORDER — AMIODARONE HCL IN DEXTROSE 360-4.14 MG/200ML-% IV SOLN: 30.0000 mg/h | INTRAVENOUS | Status: DC

## 2015-07-09 MED ORDER — AMIODARONE HCL IN DEXTROSE 360-4.14 MG/200ML-% IV SOLN
INTRAVENOUS | Status: AC
  Administered 2015-07-09: 200 mL
  Filled 2015-07-09: qty 200

## 2015-07-09 MED FILL — Potassium Chloride Inj 2 mEq/ML: INTRAVENOUS | Qty: 40 | Status: AC

## 2015-07-09 MED FILL — Magnesium Sulfate Inj 50%: INTRAMUSCULAR | Qty: 10 | Status: AC

## 2015-07-09 MED FILL — Sodium Chloride IV Soln 0.9%: INTRAVENOUS | Qty: 2000 | Status: AC

## 2015-07-09 MED FILL — Electrolyte-R (PH 7.4) Solution: INTRAVENOUS | Qty: 4000 | Status: AC

## 2015-07-09 MED FILL — Heparin Sodium (Porcine) Inj 1000 Unit/ML: INTRAMUSCULAR | Qty: 10 | Status: AC

## 2015-07-09 MED FILL — Heparin Sodium (Porcine) Inj 1000 Unit/ML: INTRAMUSCULAR | Qty: 30 | Status: AC

## 2015-07-09 MED FILL — Lidocaine HCl IV Inj 20 MG/ML: INTRAVENOUS | Qty: 5 | Status: AC

## 2015-07-09 MED FILL — Sodium Bicarbonate IV Soln 8.4%: INTRAVENOUS | Qty: 50 | Status: AC

## 2015-07-09 MED FILL — Mannitol IV Soln 20%: INTRAVENOUS | Qty: 500 | Status: AC

## 2015-07-09 NOTE — Progress Notes (Signed)
EKG CRITICAL VALUE     12 lead EKG performed.  Critical value noted.  Gwenevere Ghazi, RN notified.   Martie Lee, CCT 07/09/2015 7:33 AM

## 2015-07-09 NOTE — Progress Notes (Signed)
1 Day Post-Op Procedure(s) (LRB): CORONARY ARTERY BYPASS GRAFTING (CABG), ON PUMP, TIMES FIVE, USING LEFT INTERNAL MAMMARY ARTERY, RIGHT GREATER SAPHENOUS VEIN HARVESTED ENDOSCOPICALLY (N/A) TRANSESOPHAGEAL ECHOCARDIOGRAM (TEE) (N/A) Subjective:  No complaint  Objective: Vital signs in last 24 hours: Temp:  [95.7 F (35.4 C)-99.5 F (37.5 C)] 99.5 F (37.5 C) (10/14 0700) Pulse Rate:  [78-91] 91 (10/14 0700) Cardiac Rhythm:  [-] Atrial paced (10/14 0700) Resp:  [0-21] 20 (10/14 0700) BP: (76-157)/(48-93) 103/57 mmHg (10/14 0600) SpO2:  [98 %-100 %] 98 % (10/14 0700) FiO2 (%):  [40 %-50 %] 40 % (10/13 1806) Weight:  [76.8 kg (169 lb 5 oz)] 76.8 kg (169 lb 5 oz) (10/14 0500)  Hemodynamic parameters for last 24 hours: PAP: (21-36)/(10-25) 24/14 mmHg CO:  [3.5 L/min-6.7 L/min] 6.7 L/min CI:  [1.9 L/min/m2-3.6 L/min/m2] 3.6 L/min/m2  Intake/Output from previous day: 10/13 0701 - 10/14 0700 In: 5421.2 [I.V.:3521.2; Blood:350; IV Piggyback:1550] Out: 4325 [Urine:3925; Chest Tube:400] Intake/Output this shift:    General appearance: alert and cooperative Neurologic: intact Heart: regular rate and rhythm, S1, S2 normal, no murmur, click, rub or gallop Lungs: clear to auscultation bilaterally Extremities: edema mild Wound: dressings dry  Lab Results:  Recent Labs  07/08/15 1930 07/08/15 2004 07/09/15 0427  WBC 12.0*  --  15.6*  HGB 10.4* 10.2* 10.6*  HCT 31.6* 30.0* 31.3*  PLT 155  --  166   BMET:  Recent Labs  07/06/15 1026  07/08/15 2004 07/09/15 0427  NA 134*  < > 138 137  K 4.3  < > 4.2 4.0  CL 102  < > 107 106  CO2 22  --   --  24  GLUCOSE 102*  < > 134* 145*  BUN 16  < > 14 10  CREATININE 0.69  < > 0.50* 0.60*  CALCIUM 8.9  --   --  7.6*  < > = values in this interval not displayed.  PT/INR:  Recent Labs  07/08/15 1340  LABPROT 17.8*  INR 1.46   ABG    Component Value Date/Time   PHART 7.327* 07/08/2015 2001   HCO3 24.5* 07/08/2015 2001   TCO2 23 07/08/2015 2004   ACIDBASEDEF 2.0 07/08/2015 2001   O2SAT 98.0 07/08/2015 2001   CBG (last 3)   Recent Labs  07/08/15 1912 07/08/15 2312 07/09/15 0338  GLUCAP 108* 139* 134*   CXR: mild left base atelectasis.  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  Assessment/Plan: S/P Procedure(s) (LRB): CORONARY ARTERY BYPASS GRAFTING (CABG), ON PUMP, TIMES FIVE, USING LEFT INTERNAL MAMMARY ARTERY, RIGHT GREATER SAPHENOUS VEIN HARVESTED ENDOSCOPICALLY (N/A) TRANSESOPHAGEAL ECHOCARDIOGRAM (TEE) (N/A)  He has been hemodynamically stable on low dose neo. Wean as tolerated. Mobilize Diuresis once off neo d/c tubes/lines Continue foley due to patient in ICU and urinary output monitoring See progression orders Will resume Plavix and ASA for RCA stents.   LOS: 1 day    Gaye Pollack 07/09/2015

## 2015-07-09 NOTE — Anesthesia Postprocedure Evaluation (Signed)
  Anesthesia Post-op Note  Patient: Cristopher Estimable  Procedure(s) Performed: Procedure(s): CORONARY ARTERY BYPASS GRAFTING (CABG), ON PUMP, TIMES FIVE, USING LEFT INTERNAL MAMMARY ARTERY, RIGHT GREATER SAPHENOUS VEIN HARVESTED ENDOSCOPICALLY (N/A) TRANSESOPHAGEAL ECHOCARDIOGRAM (TEE) (N/A)  Patient Location: PACU and SICU  Anesthesia Type:General  Level of Consciousness: Patient remains intubated per anesthesia plan  Airway and Oxygen Therapy: Patient remains intubated per anesthesia plan  Post-op Pain: mild  Post-op Assessment: Post-op Vital signs reviewed              Post-op Vital Signs: Reviewed  Last Vitals:  Filed Vitals:   07/09/15 0900  BP: 91/78  Pulse: 90  Temp:   Resp: 17    Complications: No apparent anesthesia complications

## 2015-07-09 NOTE — Care Management Note (Signed)
Case Management Note  Patient Details  Name: Geoffrey [name] MRN: [id number] Date of Birth: 10-Aug-1941  Subjective/Objective:     Home with wife.  Plan for discharge home with wife 24/7 - also has supportive daughter               Action/Plan:   Expected Discharge Date:  07/13/15               Expected Discharge Plan:  Home/Self Care  In-House Referral:     Discharge planning Services  CM Consult  Post Acute Care Choice:    Choice offered to:     DME Arranged:    DME Agency:     HH Arranged:    HH Agency:     Status of Service:  In process, will continue to follow  Medicare Important Message Given:    Date Medicare IM Given:    Medicare IM give by:    Date Additional Medicare IM Given:    Additional Medicare Important Message give by:     If discussed at [place] of Stay Meetings, dates discussed:    Additional Comments:  Vergie Living, RN 07/09/2015, 3:30 PM

## 2015-07-09 NOTE — Progress Notes (Signed)
      [address]       [place] [address]             [phone]       POD # 1 CABG  BP 101/53 mmHg  Pulse 90  Temp(Src) 98.6 F (37 C) (Oral)  Resp 20  Ht 5\' 7"  (1.702 m)  Wt 169 lb 5 oz (76.8 kg)  BMI 26.51 kg/m2  SpO2 98%  Intake/Output Summary (Last 24 hours) at 07/09/15 1701 Last data filed at 07/09/15 1700  Gross per 24 hour  Intake 1714.1 ml  Output   1785 ml  Net  -70.9 ml   Hct= 30 PM BMET pending  Doing well POD # 1  Johnnisha Forton C. Roxan Hockey, MD [facility] [phone]

## 2015-07-09 NOTE — Progress Notes (Signed)
At 2255 pt noted to be in Atrial fib rate 112.  BP 97/63  Dr Roxan Hockey notified.  Pt started on Amiodarone protocol with 150 mg bolus given and drip started at 60 mg /hr.  Will continue to monitor.

## 2015-07-10 ENCOUNTER — Inpatient Hospital Stay (HOSPITAL_COMMUNITY): Payer: Commercial Managed Care - HMO

## 2015-07-10 LAB — CBC
HEMATOCRIT: 28.3 % — AB (ref 39.0–52.0)
HEMOGLOBIN: 9.6 g/dL — AB (ref 13.0–17.0)
MCH: 30.4 pg (ref 26.0–34.0)
MCHC: 33.9 g/dL (ref 30.0–36.0)
MCV: 89.6 fL (ref 78.0–100.0)
Platelets: 158 10*3/uL (ref 150–400)
RBC: 3.16 MIL/uL — ABNORMAL LOW (ref 4.22–5.81)
RDW: 13.9 % (ref 11.5–15.5)
WBC: 13.9 10*3/uL — ABNORMAL HIGH (ref 4.0–10.5)

## 2015-07-10 LAB — BASIC METABOLIC PANEL
ANION GAP: 7 (ref 5–15)
BUN: 8 mg/dL (ref 6–20)
CO2: 26 mmol/L (ref 22–32)
Calcium: 8 mg/dL — ABNORMAL LOW (ref 8.9–10.3)
Chloride: 101 mmol/L (ref 101–111)
Creatinine, Ser: 0.57 mg/dL — ABNORMAL LOW (ref 0.61–1.24)
GFR calc Af Amer: >60 mL/min (ref >60)
GLUCOSE: 145 mg/dL — AB (ref 65–99)
POTASSIUM: 3.8 mmol/L (ref 3.5–5.1)
Sodium: 134 mmol/L — ABNORMAL LOW (ref 135–145)

## 2015-07-10 LAB — GLUCOSE, CAPILLARY
GLUCOSE-CAPILLARY: 113 mg/dL — AB (ref 65–99)
GLUCOSE-CAPILLARY: 127 mg/dL — AB (ref 65–99)
GLUCOSE-CAPILLARY: 133 mg/dL — AB (ref 65–99)
GLUCOSE-CAPILLARY: 139 mg/dL — AB (ref 65–99)
Glucose-Capillary: 132 mg/dL — ABNORMAL HIGH (ref 65–99)
Glucose-Capillary: 138 mg/dL — ABNORMAL HIGH (ref 65–99)

## 2015-07-10 MED ORDER — POTASSIUM CHLORIDE 10 MEQ/50ML IV SOLN
10.0000 meq | INTRAVENOUS | Status: AC
  Administered 2015-07-10 (×4): 10 meq via INTRAVENOUS

## 2015-07-10 MED ORDER — AMIODARONE HCL IN DEXTROSE 360-4.14 MG/200ML-% IV SOLN
60.0000 mg/h | INTRAVENOUS | Status: AC
  Administered 2015-07-10: 60 mg/h via INTRAVENOUS
  Filled 2015-07-10: qty 200

## 2015-07-10 MED ORDER — AMIODARONE HCL IN DEXTROSE 360-4.14 MG/200ML-% IV SOLN
INTRAVENOUS | Status: AC
  Administered 2015-07-10: 200 mL
  Filled 2015-07-10: qty 200

## 2015-07-10 MED ORDER — AMIODARONE HCL IN DEXTROSE 360-4.14 MG/200ML-% IV SOLN: 30.0000 mg/h | INTRAVENOUS | Status: DC

## 2015-07-10 MED ORDER — AMIODARONE HCL IN DEXTROSE 360-4.14 MG/200ML-% IV SOLN
30.0000 mg/h | INTRAVENOUS | Status: DC
  Administered 2015-07-10 – 2015-07-11 (×3): 30 mg/h via INTRAVENOUS
  Filled 2015-07-10 (×7): qty 200

## 2015-07-10 MED ORDER — AMIODARONE HCL IN DEXTROSE 360-4.14 MG/200ML-% IV SOLN: 60.0000 mg/h | INTRAVENOUS | Status: DC

## 2015-07-10 NOTE — Progress Notes (Signed)
2 Days Post-Op Procedure(s) (LRB): CORONARY ARTERY BYPASS GRAFTING (CABG), ON PUMP, TIMES FIVE, USING LEFT INTERNAL MAMMARY ARTERY, RIGHT GREATER SAPHENOUS VEIN HARVESTED ENDOSCOPICALLY (N/A) TRANSESOPHAGEAL ECHOCARDIOGRAM (TEE) (N/A) Subjective: Went into atrial fib last night IV amiodarone started, neo restarted for hypotension C/o pain and some nausea  Objective: Vital signs in last 24 hours: Temp:  [97.7 F (36.5 C)-99.9 F (37.7 C)] 97.7 F (36.5 C) (10/15 0700) Pulse Rate:  [74-120] 90 (10/15 1000) Cardiac Rhythm:  [-] Atrial paced (10/15 0800) Resp:  [14-23] 19 (10/15 1000) BP: (75-130)/(39-71) 108/68 mmHg (10/15 1000) SpO2:  [93 %-100 %] 100 % (10/15 1000) Weight:  [172 lb 14.4 oz (78.427 kg)] 172 lb 14.4 oz (78.427 kg) (10/15 0400)  Hemodynamic parameters for last 24 hours:    Intake/Output from previous day: 10/14 0701 - 10/15 0700 In: 1629.9 [P.O.:780; I.V.:799.9; IV Piggyback:50] Out: 885 [Urine:885] Intake/Output this shift: Total I/O In: 417.6 [P.O.:240; I.V.:177.6] Out: 200 [Urine:200]  General appearance: alert, cooperative and no distress Neurologic: intact Heart: regular rate and rhythm Lungs: diminished breath sounds bibasilar L>R Abdomen: normal findings: soft, non-tender  Lab Results:  Recent Labs  07/09/15 1645 07/09/15 1812 07/10/15 0356  WBC 11.7*  --  13.9*  HGB 9.9* 9.9* 9.6*  HCT 30.0* 29.0* 28.3*  PLT 148*  --  158   BMET:  Recent Labs  07/09/15 0427  07/09/15 1812 07/10/15 0356  NA 137  --  135 134*  K 4.0  --  3.8 3.8  CL 106  --  99* 101  CO2 24  --   --  26  GLUCOSE 145*  --  148* 145*  BUN 10  --  9 8  CREATININE 0.60*  < > 0.60* 0.57*  CALCIUM 7.6*  --   --  8.0*  < > = values in this interval not displayed.  PT/INR:  Recent Labs  07/08/15 1340  LABPROT 17.8*  INR 1.46   ABG    Component Value Date/Time   PHART 7.327* 07/08/2015 2001   HCO3 24.5* 07/08/2015 2001   TCO2 22 07/09/2015 1812   ACIDBASEDEF 2.0  07/08/2015 2001   O2SAT 98.0 07/08/2015 2001   CBG (last 3)   Recent Labs  07/10/15 0001 07/10/15 0436 07/10/15 0814  GLUCAP 133* 127* 138*    Assessment/Plan: S/P Procedure(s) (LRB): CORONARY ARTERY BYPASS GRAFTING (CABG), ON PUMP, TIMES FIVE, USING LEFT INTERNAL MAMMARY ARTERY, RIGHT GREATER SAPHENOUS VEIN HARVESTED ENDOSCOPICALLY (N/A) TRANSESOPHAGEAL ECHOCARDIOGRAM (TEE) (N/A) POD # 2 CABG  CV- a fib with RVR last night. Now back in SR with PACs on amiodarone drip  Atrial paced to suppress ectopy  Neo restarted overnight but is off of it now  He needs to stay in SICU today due to marginal BP  RESP- left lower lobe atelectasis on chest xray- IS  RENAL- creatinine OK, supplement K  Dc Foley  ENDO- CBG well controlled  Ambulate   LOS: 2 days    Melrose Nakayama 07/10/2015

## 2015-07-10 NOTE — Progress Notes (Signed)
At 0300 Pt noted to have SBPs 70s with mean 50s.  Restarted on Neo gtt at 84mcg/min and titrated to 50 mcg/min  For mean 70s.     At 0500  Pt converted to atrial paced rhythm rate 90. Currently remains on  Amiodarone at 30 gm/hr .

## 2015-07-11 ENCOUNTER — Inpatient Hospital Stay (HOSPITAL_COMMUNITY): Payer: Commercial Managed Care - HMO

## 2015-07-11 LAB — BASIC METABOLIC PANEL
ANION GAP: 6 (ref 5–15)
BUN: 7 mg/dL (ref 6–20)
CHLORIDE: 96 mmol/L — AB (ref 101–111)
CO2: 27 mmol/L (ref 22–32)
Calcium: 7.9 mg/dL — ABNORMAL LOW (ref 8.9–10.3)
Creatinine, Ser: 0.52 mg/dL — ABNORMAL LOW (ref 0.61–1.24)
GFR calc non Af Amer: >60 mL/min (ref >60)
Glucose, Bld: 129 mg/dL — ABNORMAL HIGH (ref 65–99)
POTASSIUM: 4.3 mmol/L (ref 3.5–5.1)
SODIUM: 129 mmol/L — AB (ref 135–145)

## 2015-07-11 LAB — CBC
HCT: 28.3 % — ABNORMAL LOW (ref 39.0–52.0)
HEMOGLOBIN: 9.2 g/dL — AB (ref 13.0–17.0)
MCH: 29 pg (ref 26.0–34.0)
MCHC: 32.5 g/dL (ref 30.0–36.0)
MCV: 89.3 fL (ref 78.0–100.0)
Platelets: 133 10*3/uL — ABNORMAL LOW (ref 150–400)
RBC: 3.17 MIL/uL — AB (ref 4.22–5.81)
RDW: 13.9 % (ref 11.5–15.5)
WBC: 10.9 10*3/uL — ABNORMAL HIGH (ref 4.0–10.5)

## 2015-07-11 LAB — GLUCOSE, CAPILLARY
GLUCOSE-CAPILLARY: 107 mg/dL — AB (ref 65–99)
GLUCOSE-CAPILLARY: 112 mg/dL — AB (ref 65–99)
Glucose-Capillary: 141 mg/dL — ABNORMAL HIGH (ref 65–99)
Glucose-Capillary: 110 mg/dL — ABNORMAL HIGH (ref 65–99)
Glucose-Capillary: 119 mg/dL — ABNORMAL HIGH (ref 65–99)

## 2015-07-11 MED ORDER — SODIUM CHLORIDE 0.9 % IV SOLN: 250.0000 mL | INTRAVENOUS | Status: DC | PRN

## 2015-07-11 MED ORDER — GUAIFENESIN-DM 100-10 MG/5ML PO SYRP: 15.0000 mL | ORAL_SOLUTION | ORAL | Status: DC | PRN

## 2015-07-11 MED ORDER — MOVING RIGHT ALONG BOOK
Freq: Once | Status: AC
  Administered 2015-07-11: 14:00:00
  Filled 2015-07-11: qty 1

## 2015-07-11 MED ORDER — ALUM & MAG HYDROXIDE-SIMETH 200-200-20 MG/5ML PO SUSP: 15.0000 mL | ORAL | Status: DC | PRN

## 2015-07-11 MED ORDER — SODIUM CHLORIDE 0.9 % IJ SOLN
3.0000 mL | Freq: Two times a day (BID) | INTRAMUSCULAR | Status: DC
  Administered 2015-07-11 – 2015-07-15 (×9): 3 mL via INTRAVENOUS

## 2015-07-11 MED ORDER — INSULIN ASPART 100 UNIT/ML ~~LOC~~ SOLN
0.0000 [IU] | Freq: Three times a day (TID) | SUBCUTANEOUS | Status: DC
  Administered 2015-07-12: 2 [IU] via SUBCUTANEOUS

## 2015-07-11 MED ORDER — SODIUM CHLORIDE 0.9 % IJ SOLN: 3.0000 mL | INTRAMUSCULAR | Status: DC | PRN

## 2015-07-11 MED ORDER — MAGNESIUM HYDROXIDE 400 MG/5ML PO SUSP: 30.0000 mL | Freq: Every day | ORAL | Status: DC | PRN

## 2015-07-11 MED ORDER — ZOLPIDEM TARTRATE 5 MG PO TABS
5.0000 mg | ORAL_TABLET | Freq: Every evening | ORAL | Status: DC | PRN
  Administered 2015-07-14 – 2015-07-15 (×2): 5 mg via ORAL
  Filled 2015-07-11 (×2): qty 1

## 2015-07-11 NOTE — Progress Notes (Signed)
Pt transferred to 2W37 with belongings. Report given to receiving RN and all questions answered. VSS during transfer. Family at bedside.

## 2015-07-11 NOTE — Progress Notes (Signed)
3 Days Post-Op Procedure(s) (LRB): CORONARY ARTERY BYPASS GRAFTING (CABG), ON PUMP, TIMES FIVE, USING LEFT INTERNAL MAMMARY ARTERY, RIGHT GREATER SAPHENOUS VEIN HARVESTED ENDOSCOPICALLY (N/A) TRANSESOPHAGEAL ECHOCARDIOGRAM (TEE) (N/A) Subjective: Still having incisional pain  Objective: Vital signs in last 24 hours: Temp:  [98 F (36.7 C)-99 F (37.2 C)] 98 F (36.7 C) (10/16 0400) Pulse Rate:  [71-96] 71 (10/16 0800) Cardiac Rhythm:  [-] Atrial fibrillation;Ventricular paced (10/16 0800) Resp:  [11-22] 14 (10/16 0800) BP: (93-125)/(55-70) 96/55 mmHg (10/16 0800) SpO2:  [90 %-100 %] 92 % (10/16 0800) Weight:  [169 lb 12.8 oz (77.021 kg)] 169 lb 12.8 oz (77.021 kg) (10/16 0500)  Hemodynamic parameters for last 24 hours:    Intake/Output from previous day: 10/15 0701 - 10/16 0700 In: 8588.7 [P.O.:840; I.V.:7648.7; IV Piggyback:100] Out: 3818 [Urine:1470] Intake/Output this shift: Total I/O In: 26.7 [I.V.:26.7] Out: -   General appearance: alert, cooperative and no distress Neurologic: intact Heart: regular rate and rhythm Lungs: diminished breath sounds bibasilar Abdomen: normal findings: soft, non-tender  Lab Results:  Recent Labs  07/10/15 0356 07/11/15 0359  WBC 13.9* 10.9*  HGB 9.6* 9.2*  HCT 28.3* 28.3*  PLT 158 133*   BMET:  Recent Labs  07/10/15 0356 07/11/15 0359  NA 134* 129*  K 3.8 4.3  CL 101 96*  CO2 26 27  GLUCOSE 145* 129*  BUN 8 7  CREATININE 0.57* 0.52*  CALCIUM 8.0* 7.9*    PT/INR:  Recent Labs  07/08/15 1340  LABPROT 17.8*  INR 1.46   ABG    Component Value Date/Time   PHART 7.327* 07/08/2015 2001   HCO3 24.5* 07/08/2015 2001   TCO2 22 07/09/2015 1812   ACIDBASEDEF 2.0 07/08/2015 2001   O2SAT 98.0 07/08/2015 2001   CBG (last 3)   Recent Labs  07/10/15 1737 07/10/15 2055 07/11/15 0807  GLUCAP 107* 139* 141*    Assessment/Plan: S/P Procedure(s) (LRB): CORONARY ARTERY BYPASS GRAFTING (CABG), ON PUMP, TIMES FIVE,  USING LEFT INTERNAL MAMMARY ARTERY, RIGHT GREATER SAPHENOUS VEIN HARVESTED ENDOSCOPICALLY (N/A) TRANSESOPHAGEAL ECHOCARDIOGRAM (TEE) (N/A) -  CV- atrial fib yesterday, now in SR with PACs, intermittently brady  Continue IV amiodarone today  Continue lopressor with VVI back up  Hold off on ACE-I until BP improves  On plavix  RESP- bibasilar atelectasis- IS  RENAL- creatinine OK, weight near baseline  ENDO- change CBG to [facility] HS  Ambulate  Transfer to 2 west   LOS: 3 days    Melrose Nakayama 07/11/2015

## 2015-07-12 ENCOUNTER — Inpatient Hospital Stay (HOSPITAL_COMMUNITY): Payer: Commercial Managed Care - HMO

## 2015-07-12 LAB — CBC
HEMATOCRIT: 27.2 % — AB (ref 39.0–52.0)
Hemoglobin: 8.9 g/dL — ABNORMAL LOW (ref 13.0–17.0)
MCH: 29.3 pg (ref 26.0–34.0)
MCHC: 32.7 g/dL (ref 30.0–36.0)
MCV: 89.5 fL (ref 78.0–100.0)
PLATELETS: 183 10*3/uL (ref 150–400)
RBC: 3.04 MIL/uL — AB (ref 4.22–5.81)
RDW: 13.9 % (ref 11.5–15.5)
WBC: 9.5 10*3/uL (ref 4.0–10.5)

## 2015-07-12 LAB — BASIC METABOLIC PANEL
ANION GAP: 8 (ref 5–15)
BUN: 7 mg/dL (ref 6–20)
CO2: 29 mmol/L (ref 22–32)
Calcium: 8.2 mg/dL — ABNORMAL LOW (ref 8.9–10.3)
Chloride: 99 mmol/L — ABNORMAL LOW (ref 101–111)
Creatinine, Ser: 0.48 mg/dL — ABNORMAL LOW (ref 0.61–1.24)
GFR calc Af Amer: >60 mL/min (ref >60)
GLUCOSE: 120 mg/dL — AB (ref 65–99)
POTASSIUM: 4 mmol/L (ref 3.5–5.1)
Sodium: 136 mmol/L (ref 135–145)

## 2015-07-12 LAB — GLUCOSE, CAPILLARY
GLUCOSE-CAPILLARY: 107 mg/dL — AB (ref 65–99)
GLUCOSE-CAPILLARY: 119 mg/dL — AB (ref 65–99)
Glucose-Capillary: 116 mg/dL — ABNORMAL HIGH (ref 65–99)
Glucose-Capillary: 134 mg/dL — ABNORMAL HIGH (ref 65–99)

## 2015-07-12 MED ORDER — AMIODARONE HCL 200 MG PO TABS
200.0000 mg | ORAL_TABLET | Freq: Two times a day (BID) | ORAL | Status: DC
  Administered 2015-07-12 – 2015-07-13 (×3): 200 mg via ORAL
  Filled 2015-07-12 (×3): qty 1

## 2015-07-12 MED ORDER — FUROSEMIDE 40 MG PO TABS
40.0000 mg | ORAL_TABLET | Freq: Every day | ORAL | Status: DC
  Administered 2015-07-12 – 2015-07-13 (×2): 40 mg via ORAL
  Filled 2015-07-12 (×2): qty 1

## 2015-07-12 MED ORDER — POTASSIUM CHLORIDE CRYS ER 20 MEQ PO TBCR
20.0000 meq | EXTENDED_RELEASE_TABLET | Freq: Two times a day (BID) | ORAL | Status: DC
  Administered 2015-07-12 – 2015-07-13 (×4): 20 meq via ORAL
  Filled 2015-07-12 (×4): qty 1

## 2015-07-12 MED ORDER — LACTULOSE 10 GM/15ML PO SOLN: 20.0000 g | Freq: Every day | ORAL | Status: DC | PRN

## 2015-07-12 NOTE — Progress Notes (Signed)
CARDIAC REHAB PHASE I   PRE:  Rate/Rhythm: 102 a. fib  BP:  Sitting: 114/69        SaO2: 95 RA  MODE:  Ambulation: 310 ft   POST:  Rate/Rhythm: 111 a.fib  BP:  Sitting: 127/70         SaO2: 93 RA  Pt ambulated 310 ft on RA, pacer, assist x1, steady gait, tolerated well. Pt did need reminder cues for sternal precautions. Pt denies pain, dizziness, DOE, declined rest stop. Pt encouraged to ambulated with staff x 2 more today, encouraged IS. Pt verbalized understanding. Pt to recliner after walk, call bell within reach. Will follow.   [phone]  Lenna Sciara, RN, BSN 07/12/2015 9:52 AM

## 2015-07-12 NOTE — Care Management Important Message (Signed)
Important Message  Patient Details  Name: Geoffrey [name] MRN: [id number] Date of Birth: [birth year]   Medicare Important Message Given:  Yes-second notification given    Nathen May 07/12/2015, 11:54 AM

## 2015-07-12 NOTE — Progress Notes (Addendum)
      [address]       [name],Geoffrey [name] [address]             [phone]      4 Days Post-Op Procedure(s) (LRB): CORONARY ARTERY BYPASS GRAFTING (CABG), ON PUMP, TIMES FIVE, USING LEFT INTERNAL MAMMARY ARTERY, RIGHT GREATER SAPHENOUS VEIN HARVESTED ENDOSCOPICALLY (N/A) TRANSESOPHAGEAL ECHOCARDIOGRAM (TEE) (N/A)   Subjective:  Geoffrey [name] complains of pain along his left chest where his chest tubes were.  He also wants to get out of here.  + ambulation    No BM  Objective: Vital signs in last 24 hours: Temp:  [98.4 F (36.9 C)-98.8 F (37.1 C)] 98.8 F (37.1 C) (10/17 0422) Pulse Rate:  [75-89] 86 (10/17 0422) Cardiac Rhythm:  [-] Atrial fibrillation (10/17 0756) Resp:  [16-20] 16 (10/17 0422) BP: (107-122)/(59-65) 119/59 mmHg (10/17 0422) SpO2:  [90 %-93 %] 90 % (10/17 0422) Weight:  [172 lb 2.9 oz (78.1 kg)] 172 lb 2.9 oz (78.1 kg) (10/17 0422) Intake/Output from previous day: 10/16 0701 - 10/17 0700 In: 613.5 [P.O.:480; I.V.:133.5] Out: -   General appearance: alert, cooperative and no distress Heart: irregularly irregular rhythm Lungs: clear to auscultation bilaterally Abdomen: soft, non-tender; bowel sounds normal; no masses,  no organomegaly Extremities: edema trace Wound: clean and ry  Lab Results:  Recent Labs  07/11/15 0359 07/12/15 0211  WBC 10.9* 9.5  HGB 9.2* 8.9*  HCT 28.3* 27.2*  PLT 133* 183   BMET:  Recent Labs  07/11/15 0359 07/12/15 0211  NA 129* 136  K 4.3 4.0  CL 96* 99*  CO2 27 29  GLUCOSE 129* 120*  BUN 7 7  CREATININE 0.52* 0.48*  CALCIUM 7.9* 8.2*    PT/INR: No results for input(s): LABPROT, INR in the last 72 hours. ABG    Component Value Date/Time   PHART 7.327* 07/08/2015 2001   HCO3 24.5* 07/08/2015 2001   TCO2 22 07/09/2015 1812   ACIDBASEDEF 2.0 07/08/2015 2001   O2SAT 98.0 07/08/2015 2001   CBG (last 3)   Recent Labs  07/11/15 1631 07/11/15 2125 07/12/15 0608  GLUCAP 112* 119* 116*     Assessment/Plan: S/P Procedure(s) (LRB): CORONARY ARTERY BYPASS GRAFTING (CABG), ON PUMP, TIMES FIVE, USING LEFT INTERNAL MAMMARY ARTERY, RIGHT GREATER SAPHENOUS VEIN HARVESTED ENDOSCOPICALLY (N/A) TRANSESOPHAGEAL ECHOCARDIOGRAM (TEE) (N/A)  1. CV- Atrial Fibrillation- IV amiodarone stopped on 10/15-however not placed on oral amiodarone, will start and continue Lopressor 2. Pulm- no acute issues, small effusion/consolidation left base continue IS 3. Renal- creatinine WNL, mild hypervolemia- not currently on lasix 4. GI- LOC constipation, Lactulose prn 5. Dispo- patient with A. Fib, will start Amiodarone continue Lopressor, if unable to convert to NSR may need anticoagulation vs. NOAC prior to discharge   LOS: 4 days    BARRETT, ERIN 07/12/2015   Chart reviewed, patient examined, agree with above. Will increase amio dose. He is on ASA and Plavix for recent DES so I would not start him on another anticoagulant on top of that at this time.

## 2015-07-12 NOTE — Progress Notes (Signed)
Utilization review completed.  

## 2015-07-13 LAB — GLUCOSE, CAPILLARY
GLUCOSE-CAPILLARY: 97 mg/dL (ref 65–99)
GLUCOSE-CAPILLARY: 105 mg/dL — AB (ref 65–99)
Glucose-Capillary: 106 mg/dL — ABNORMAL HIGH (ref 65–99)
Glucose-Capillary: 94 mg/dL (ref 65–99)

## 2015-07-13 MED ORDER — METOPROLOL TARTRATE 25 MG PO TABS
25.0000 mg | ORAL_TABLET | Freq: Two times a day (BID) | ORAL | Status: DC
  Administered 2015-07-13 – 2015-07-14 (×3): 25 mg via ORAL
  Filled 2015-07-13 (×3): qty 1

## 2015-07-13 MED ORDER — LISINOPRIL 2.5 MG PO TABS
2.5000 mg | ORAL_TABLET | Freq: Every day | ORAL | Status: DC
  Administered 2015-07-13 – 2015-07-16 (×4): 2.5 mg via ORAL
  Filled 2015-07-13 (×4): qty 1

## 2015-07-13 MED ORDER — METOPROLOL TARTRATE 25 MG/10 ML ORAL SUSPENSION: 12.5000 mg | Freq: Two times a day (BID) | ORAL | Status: DC

## 2015-07-13 MED ORDER — AMIODARONE HCL 200 MG PO TABS
400.0000 mg | ORAL_TABLET | Freq: Two times a day (BID) | ORAL | Status: DC
  Administered 2015-07-13 – 2015-07-16 (×6): 400 mg via ORAL
  Filled 2015-07-13 (×6): qty 2

## 2015-07-13 NOTE — Progress Notes (Signed)
[address]       [place] [address]             [phone]      5 Days Post-Op Procedure(s) (LRB): CORONARY ARTERY BYPASS GRAFTING (CABG), ON PUMP, TIMES FIVE, USING LEFT INTERNAL MAMMARY ARTERY, RIGHT GREATER SAPHENOUS VEIN HARVESTED ENDOSCOPICALLY (N/A) TRANSESOPHAGEAL ECHOCARDIOGRAM (TEE) (N/A) Subjective: Feels ok, but still in afib  Objective: Vital signs in last 24 hours: Temp:  [98.3 F (36.8 C)-98.5 F (36.9 C)] 98.5 F (36.9 C) (10/18 0348) Pulse Rate:  [74-120] 80 (10/18 0348) Cardiac Rhythm:  [-] Sinus tachycardia;Other (Comment) (10/17 2245) Resp:  [16-18] 16 (10/18 0348) BP: (107-149)/(42-75) 116/42 mmHg (10/18 0348) SpO2:  [94 %-96 %] 94 % (10/18 0348) Weight:  [167 lb 4.8 oz (75.887 kg)] 167 lb 4.8 oz (75.887 kg) (10/18 0348)  Hemodynamic parameters for last 24 hours:    Intake/Output from previous day: 10/17 0701 - 10/18 0700 In: 1083 [P.O.:1080; I.V.:3] Out: 2000 [Urine:2000] Intake/Output this shift:    General appearance: alert, cooperative and no distress Heart: irregularly irregular rhythm and tachy Lungs: dim in lower fields Abdomen: benign Extremities: minor edema Wound: incis healing well  Lab Results:  Recent Labs  07/11/15 0359 07/12/15 0211  WBC 10.9* 9.5  HGB 9.2* 8.9*  HCT 28.3* 27.2*  PLT 133* 183   BMET:  Recent Labs  07/11/15 0359 07/12/15 0211  NA 129* 136  K 4.3 4.0  CL 96* 99*  CO2 27 29  GLUCOSE 129* 120*  BUN 7 7  CREATININE 0.52* 0.48*  CALCIUM 7.9* 8.2*    PT/INR: No results for input(s): LABPROT, INR in the last 72 hours. ABG    Component Value Date/Time   PHART 7.327* 07/08/2015 2001   HCO3 24.5* 07/08/2015 2001   TCO2 22 07/09/2015 1812   ACIDBASEDEF 2.0 07/08/2015 2001   O2SAT 98.0 07/08/2015 2001   CBG (last 3)   Recent Labs  07/12/15 1719 07/12/15 2239 07/13/15 0615  GLUCAP 107* 119* 97    Meds Scheduled Meds: . acetaminophen  1,000 mg Oral 4 times per day    . amiodarone  200 mg Oral BID  . aspirin EC  81 mg Oral Daily   Or  . aspirin  81 mg Per Tube Daily  . atorvastatin  80 mg Oral q1800  . bisacodyl  10 mg Oral Daily   Or  . bisacodyl  10 mg Rectal Daily  . clopidogrel  75 mg Oral Daily  . docusate sodium  200 mg Oral Daily  . furosemide  40 mg Oral Daily  . insulin aspart  0-15 Units Subcutaneous TID WC  . metoprolol tartrate  12.5 mg Oral BID   Or  . metoprolol tartrate  12.5 mg Per Tube BID  . pantoprazole  40 mg Oral Daily  . potassium chloride  20 mEq Oral BID  . sodium chloride  3 mL Intravenous Q12H   Continuous Infusions:  PRN Meds:.sodium chloride, alum & mag hydroxide-simeth, guaiFENesin-dextromethorphan, lactulose, magnesium hydroxide, metoprolol, ondansetron (ZOFRAN) IV, oxyCODONE, sodium chloride, traMADol, zolpidem  Xrays Dg Chest 2 View  07/12/2015  CLINICAL DATA:  Subsequent encounter for postop EXAM: CHEST  2 VIEW COMPARISON:  07/11/2015. FINDINGS: Interstitial markings are diffusely coarsened with chronic features. Left base collapse/ consolidation with effusion persists, similar to before. Cardiopericardial silhouette is at upper limits of normal for size. Imaged bony structures of the thorax are intact. Telemetry leads overlie the chest. IMPRESSION: Stable  exam.  Left base collapse/consolidation with left effusion. Electronically Signed   By: Misty Stanley M.D.   On: 07/12/2015 07:44    Assessment/Plan: S/P Procedure(s) (LRB): CORONARY ARTERY BYPASS GRAFTING (CABG), ON PUMP, TIMES FIVE, USING LEFT INTERNAL MAMMARY ARTERY, RIGHT GREATER SAPHENOUS VEIN HARVESTED ENDOSCOPICALLY (N/A) TRANSESOPHAGEAL ECHOCARDIOGRAM (TEE) (N/A)  1 doing well overall 2 on amio- will increase betablocker dose, on plavix 3 cont gentle diuresis 4 BP should tolerate low dose ace  LOS: 5 days    Geoffrey [name] 07/13/2015

## 2015-07-13 NOTE — Progress Notes (Signed)
CARDIAC REHAB PHASE I   PRE:  Rate/Rhythm: 86 SR  BP:  Supine:   Sitting: 118/66  Standing:    SaO2: 98%RA  MODE:  Ambulation: 500 ft   POST:  Rate/Rhythm: 88 SR  BP:  Supine:   Sitting: 120/70  Standing:    SaO2: 98%RA 1030-1055 Pt walked 500 ft with rolling walker with steady gait. Tolerated well. To recliner after walk. Pt asking about CRP 2. Discussed and will refer to [facility] program.   Graylon Good, RN BSN  07/13/2015 11:36 AM

## 2015-07-14 ENCOUNTER — Encounter (HOSPITAL_COMMUNITY): Payer: Self-pay | Admitting: Surgical

## 2015-07-14 LAB — GLUCOSE, CAPILLARY
GLUCOSE-CAPILLARY: 107 mg/dL — AB (ref 65–99)
Glucose-Capillary: 103 mg/dL — ABNORMAL HIGH (ref 65–99)
Glucose-Capillary: 106 mg/dL — ABNORMAL HIGH (ref 65–99)
Glucose-Capillary: 95 mg/dL (ref 65–99)

## 2015-07-14 MED ORDER — AMIODARONE IV BOLUS ONLY 150 MG/100ML
150.0000 mg | Freq: Once | INTRAVENOUS | Status: AC
  Administered 2015-07-14: 150 mg via INTRAVENOUS
  Filled 2015-07-14: qty 100

## 2015-07-14 MED ORDER — METOPROLOL TARTRATE 25 MG PO TABS
25.0000 mg | ORAL_TABLET | Freq: Three times a day (TID) | ORAL | Status: DC
  Administered 2015-07-14 – 2015-07-16 (×6): 25 mg via ORAL
  Filled 2015-07-14 (×6): qty 1

## 2015-07-14 NOTE — Progress Notes (Signed)
Nursing note Patient went into atrial fib around 810am this morning on monitor, rate 120-137, pt BP 128/81, pt slightly short of breath, pt stated he "worked really hard to eat his breakfast because he was hungry" pt in chair, will continue to monitor patient. Savita Runner, Bettina Gavia RN

## 2015-07-14 NOTE — Discharge Summary (Signed)
Physician Discharge Summary  Patient ID: Geoffrey [name] MRN: [id number] DOB/AGE: 1941-07-08 74 y.o.  Admit date: 07/08/2015 Discharge date: 07/14/2015  Admission Diagnoses: Coronary artery disease Postoperative atrial fibrillation   Discharge Diagnoses:  Active Problems:   S/P CABG x 5  Patient Active Problem List   Diagnosis Date Noted  . S/P CABG x 5 07/08/2015  . Coronary artery disease 06/18/2015  . 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 S/P drug eluting coronary stent placement 06/18/2015     Past Medical History  Diagnosis Date  . CAD (coronary artery disease)   . HLD (hyperlipidemia)   . Hypertension   . Heart attack     Past Surgical History  Procedure Laterality Date  . Hernia repair    . Coronary angioplasty with stent placement               HPI:at time of consultation  The patient is a 74 year old gentleman with a history of hypertension and hyperlipidemia who was traveling in [place] and suffered and inferior STEMI on 05/23/2015. He developed substernal chest discomfort described as pressure radiating to both arms and associated with diaphoresis and shortness of breath. Cath showed an occluded RCA as well as severe multi-vessel coronary artery disease. He underwent successful PCI of the RCA with 2 drug eluting stents (Synergy 3.5 x 38 mm and 3.5 x 20 mm). The peak troponin I that was measured was 3.26. He had an uneventful course and was discharged on ASA and Plavix. He followed up with Dr. Pleas Koch and was referred to me for consideration of CABG. He says that he has felt well since discharge with no chest discomfort or shortness of breath.  Admitted for the procedure  Discharged Condition: good  Hospital Course: The patient has overall done well. He was extubated without difficulty and is neurologically intact. All routine  lines, monitors and drainage devices have been discontinued in the standard fashion. He has a mild acute blood loss anemia which is stable. He has had some mild postoperative volume overload which has responded to diuretics. He has had postoperative atrial fibrillation which has been somewhat difficult to control but is stable on amiodarone and beta blocker with only intermittent paroxysms. He is felt to require short-term Coumadin for anticoagulation due to this. Incisions are noted be healing well without evidence of infection. Oxygen has been weaned and he maintains good saturations on room air. He is tolerating ambulation using standard postoperative protocols. He is overall felt to be quite stable for discharge on today's date.   Consults: cardiology  Significant Diagnostic Studies: routine post-op labs and CXR  Treatments: surgery:   07/08/2015  Surgeon: Gaye Pollack, MD  First Assistant: Jadene Pierini, PA-C   Preoperative Diagnosis: Severe multi-vessel coronary artery disease, s/p PCI/DES of RCA   Postoperative Diagnosis: Same   Procedure:  1. Median Sternotomy 2. Extracorporeal circulation 3. Coronary artery bypass grafting x 5   Left internal mammary graft to the LAD  SVG to diagonal  SVG to OM  Sequential SVG to PDA ad PL 4. Endoscopic vein harvest from the right leg   Anesthesia: General Endotracheal   Discharge Exam: Blood pressure 128/81, pulse 122, temperature 98.4 F (36.9 C), temperature source Oral, resp. rate 18, height 5\' 7"  (1.702 m), weight 166 lb 0.1 oz (75.3 kg), SpO2 97 %.  General appearance: alert, cooperative and no distress Heart: regular rate and rhythm Lungs: clear to  auscultation bilaterally Abdomen: benign Extremities: no edema Wound: incis healing well   Disposition: Final discharge disposition not confirmed  Discharge Instructions    Amb Referral to Cardiac Rehabilitation    Complete by:  As directed   Diagnosis:   CABG          medications at time of discharge:  An After Visit Summary was printed and given to the patient.   Medication List    STOP taking these medications        aspirin 81 MG chewable tablet     metoprolol succinate 25 MG 24 hr tablet  Commonly known as:  TOPROL-XL     NITROSTAT 0.4 MG SL tablet  Generic drug:  nitroGLYCERIN      TAKE these medications        amiodarone 400 MG tablet  Commonly known as:  PACERONE  Take 1 tablet (400 mg total) by mouth 2 (two) times daily. For 5 days then change to 400 mg once daily     atorvastatin 80 MG tablet  Commonly known as:  LIPITOR  Take 80 mg by mouth daily at 6 PM.     cholecalciferol 1000 UNITS tablet  Commonly known as:  VITAMIN D  Take 1,000 Units by mouth daily.     clopidogrel 75 MG tablet  Commonly known as:  PLAVIX  Take 75 mg by mouth daily.     lisinopril 2.5 MG tablet  Commonly known as:  PRINIVIL,ZESTRIL  Take 2.5 mg by mouth daily.     Magnesium 100 MG Tabs  Take 1 tablet by mouth daily.     metoprolol 50 MG tablet  Commonly known as:  LOPRESSOR  Take 1 tablet (50 mg total) by mouth 2 (two) times daily.     oxyCODONE 5 MG immediate release tablet  Commonly known as:  Oxy IR/ROXICODONE  Take 1-2 tablets (5-10 mg total) by mouth every 4 (four) hours as needed for severe pain.     Potassium 99 MG Tabs  Take 1 tablet by mouth daily.     vitamin B-12 100 MCG tablet  Commonly known as:  CYANOCOBALAMIN  Take 100 mcg by mouth daily.     warfarin 2.5 MG tablet  Commonly known as:  COUMADIN  Take 1 tablet (2.5 mg total) by mouth daily at 6 PM. As directed through the coumadin clinic       Follow-up Information    Follow up with Gaye Pollack, MD.   Specialty:  Cardiothoracic Surgery   Why:  08/11/2015 at 9:30 AM to see the surgeon. Please obtain a chest x-ray [facility] at 9 AM. [facility] is located in the same office complex as the surgeon.   Contact information:   [address] [place] [address] [phone]       Follow up with Rozann Lesches, MD.   Specialty:  Cardiology   Why:  2 week cardiology follow up   Contact information:   [place]  [address] [phone]      He Coumadin clinic appointment will be made for the patient on Monday, 07/19/2015 to check INR at the cardiology office in [place].    The patient has been discharged on:   1.Beta Blocker:  Yes [ y  ]                              No   [   ]  If No, reason:  2.Ace Inhibitor/ARB: Yes [ y  ]                                     No  [    ]                                     If No, reason:  3.Statin:   Yes [ y  ]                  No  [   ]                  If No, reason:  4.Ecasa:  Yes  [  y ]                  No   [   ]                  If No, reason:   Signed: GOLD,WAYNE E 07/14/2015, 11:01 AM

## 2015-07-14 NOTE — Progress Notes (Addendum)
Pt converted to NSR around 1450 on monitor pt currently in SR rate 78 on monitor will continue to monitor patient. Blake Vetrano, Bettina Gavia RN

## 2015-07-14 NOTE — Progress Notes (Signed)
Patient ambulated in hallway with walker pt heart rate atrial fib rate 110-117 on monitor will continue to monitor patient. Geoffrey [name], Bettina Gavia RN

## 2015-07-14 NOTE — Progress Notes (Signed)
CARDIAC REHAB PHASE I   PRE:  Rate/Rhythm: 107 afib  BP:  Supine:   Sitting: 112/68  Standing:    SaO2: 97%RA  MODE:  Ambulation: 890 ft   POST:  Rate/Rhythm: 115 afib  BP:  Supine:   Sitting: 124/70  Standing:    SaO2: 98%RA 1125-1150 Pt walked 890 ft on RA with rolling walker with steady gait. Tolerated well. No complaints. To recliner after walk.   Graylon Good, RN BSN  07/14/2015 11:47 AM

## 2015-07-14 NOTE — Progress Notes (Signed)
Pt went into AFib around 9 PM this evening for 20-30 minutes, then converted back to NSR.  Pt asymptomatic, VSS.  Will cont to monitor pt closely.

## 2015-07-14 NOTE — Progress Notes (Addendum)
[address]       [place], [address]             [phone]      6 Days Post-Op Procedure(s) (LRB): CORONARY ARTERY BYPASS GRAFTING (CABG), ON PUMP, TIMES FIVE, USING LEFT INTERNAL MAMMARY ARTERY, RIGHT GREATER SAPHENOUS VEIN HARVESTED ENDOSCOPICALLY (N/A) TRANSESOPHAGEAL ECHOCARDIOGRAM (TEE) (N/A) Subjective: Feels pretty well, still with intermit paroxysms of afib  Objective: Vital signs in last 24 hours: Temp:  [98.2 F (36.8 C)-98.4 F (36.9 C)] 98.4 F (36.9 C) (10/19 0533) Pulse Rate:  [74-122] 122 (10/19 0813) Cardiac Rhythm:  [-] Atrial fibrillation (10/19 0807) Resp:  [17-18] 18 (10/19 0533) BP: (116-128)/(65-81) 128/81 mmHg (10/19 0813) SpO2:  [97 %-99 %] 97 % (10/19 0533) Weight:  [166 lb 0.1 oz (75.3 kg)] 166 lb 0.1 oz (75.3 kg) (10/19 0533)  Hemodynamic parameters for last 24 hours:    Intake/Output from previous day: 10/18 0701 - 10/19 0700 In: 960 [P.O.:960] Out: -  Intake/Output this shift:    General appearance: alert, cooperative and no distress Heart: irregularly irregular rhythm Lungs: mildly dim in the bases Abdomen: benign Extremities: mild edema Wound: incis healing well  Lab Results:  Recent Labs  07/12/15 0211  WBC 9.5  HGB 8.9*  HCT 27.2*  PLT 183   BMET:  Recent Labs  07/12/15 0211  NA 136  K 4.0  CL 99*  CO2 29  GLUCOSE 120*  BUN 7  CREATININE 0.48*  CALCIUM 8.2*    PT/INR: No results for input(s): LABPROT, INR in the last 72 hours. ABG    Component Value Date/Time   PHART 7.327* 07/08/2015 2001   HCO3 24.5* 07/08/2015 2001   TCO2 22 07/09/2015 1812   ACIDBASEDEF 2.0 07/08/2015 2001   O2SAT 98.0 07/08/2015 2001   CBG (last 3)   Recent Labs  07/13/15 1635 07/13/15 2036 07/14/15 0625  GLUCAP 106* 94 103*    Meds Scheduled Meds: . amiodarone  400 mg Oral BID  . aspirin EC  81 mg Oral Daily   Or  . aspirin  81 mg Per Tube Daily  . atorvastatin  80 mg Oral q1800  . bisacodyl   10 mg Oral Daily   Or  . bisacodyl  10 mg Rectal Daily  . clopidogrel  75 mg Oral Daily  . docusate sodium  200 mg Oral Daily  . furosemide  40 mg Oral Daily  . insulin aspart  0-15 Units Subcutaneous TID WC  . lisinopril  2.5 mg Oral Daily  . metoprolol tartrate  25 mg Oral BID   Or  . metoprolol tartrate  12.5 mg Per Tube BID  . pantoprazole  40 mg Oral Daily  . potassium chloride  20 mEq Oral BID  . sodium chloride  3 mL Intravenous Q12H   Continuous Infusions:  PRN Meds:.sodium chloride, alum & mag hydroxide-simeth, guaiFENesin-dextromethorphan, lactulose, magnesium hydroxide, metoprolol, ondansetron (ZOFRAN) IV, oxyCODONE, sodium chloride, traMADol, zolpidem  Xrays No results found.  Assessment/Plan: S/P Procedure(s) (LRB): CORONARY ARTERY BYPASS GRAFTING (CABG), ON PUMP, TIMES FIVE, USING LEFT INTERNAL MAMMARY ARTERY, RIGHT GREATER SAPHENOUS VEIN HARVESTED ENDOSCOPICALLY (N/A) TRANSESOPHAGEAL ECHOCARDIOGRAM (TEE) (N/A)  1 conts with afib, intermit. The amio and betablocker dose were increased yesterday- cont to monitor closely. May need coumadin 2 cont gentle diuresis 3 BP stable 4 sugars well controlled  LOS: 6 days    [name],[name] 07/14/2015   Chart reviewed, patient examined, agree with above. He was  in sinus all day yesterday but back in atrial fib with rate 120 this am. Will give him a bolus of amio this am. Increase lopressor to tid. He is on ASA and Plavix for his recent DES so I would not start coumadin at this time due to risk of bleeding.

## 2015-07-15 ENCOUNTER — Encounter (HOSPITAL_COMMUNITY): Payer: Self-pay | Admitting: Cardiology

## 2015-07-15 DIAGNOSIS — Z951 Presence of aortocoronary bypass graft: Secondary | ICD-10-CM

## 2015-07-15 DIAGNOSIS — I480 Paroxysmal atrial fibrillation: Secondary | ICD-10-CM

## 2015-07-15 LAB — GLUCOSE, CAPILLARY
GLUCOSE-CAPILLARY: 99 mg/dL (ref 65–99)
GLUCOSE-CAPILLARY: 98 mg/dL (ref 65–99)
GLUCOSE-CAPILLARY: 92 mg/dL (ref 65–99)
Glucose-Capillary: 112 mg/dL — ABNORMAL HIGH (ref 65–99)

## 2015-07-15 NOTE — Progress Notes (Signed)
CARDIAC REHAB PHASE I   Ed completed with pt and wife. Voiced understanding. Gave Off the Beat booklet and set up video. Also gave instructions for d/c video.  [place], [place], ACSM 07/15/2015 12:32 PM

## 2015-07-15 NOTE — Progress Notes (Signed)
Utilization review completed.  

## 2015-07-15 NOTE — Progress Notes (Addendum)
[address]       [place] [address]             [phone]      7 Days Post-Op Procedure(s) (LRB): CORONARY ARTERY BYPASS GRAFTING (CABG), ON PUMP, TIMES FIVE, USING LEFT INTERNAL MAMMARY ARTERY, RIGHT GREATER SAPHENOUS VEIN HARVESTED ENDOSCOPICALLY (N/A) TRANSESOPHAGEAL ECHOCARDIOGRAM (TEE) (N/A) Subjective: Still with some afib, rate is better controlled  Objective: Vital signs in last 24 hours: Temp:  [97.8 F (36.6 C)-98.5 F (36.9 C)] 98.2 F (36.8 C) (10/20 0532) Pulse Rate:  [76-122] 84 (10/20 0532) Cardiac Rhythm:  [-] Atrial fibrillation (10/19 2101) Resp:  [18] 18 (10/20 0532) BP: (111-136)/(56-81) 136/60 mmHg (10/20 0532) SpO2:  [97 %-98 %] 97 % (10/20 0532) Weight:  [162 lb 0.6 oz (73.5 kg)] 162 lb 0.6 oz (73.5 kg) (10/20 0532)  Hemodynamic parameters for last 24 hours:    Intake/Output from previous day: 10/19 0701 - 10/20 0700 In: 720 [P.O.:720] Out: -  Intake/Output this shift:    General appearance: alert, cooperative and no distress Heart: regular rate and rhythm Lungs: mildly dim left base Abdomen: benign Extremities: no edema Wound: incis healing well  Lab Results: No results for input(s): WBC, HGB, HCT, PLT in the last 72 hours. BMET: No results for input(s): NA, K, CL, CO2, GLUCOSE, BUN, CREATININE, CALCIUM in the last 72 hours.  PT/INR: No results for input(s): LABPROT, INR in the last 72 hours. ABG    Component Value Date/Time   PHART 7.327* 07/08/2015 2001   HCO3 24.5* 07/08/2015 2001   TCO2 22 07/09/2015 1812   ACIDBASEDEF 2.0 07/08/2015 2001   O2SAT 98.0 07/08/2015 2001   CBG (last 3)   Recent Labs  07/14/15 1649 07/14/15 2135 07/15/15 0539  GLUCAP 95 107* 99    Meds Scheduled Meds: . amiodarone  400 mg Oral BID  . aspirin EC  81 mg Oral Daily   Or  . aspirin  81 mg Per Tube Daily  . atorvastatin  80 mg Oral q1800  . bisacodyl  10 mg Oral Daily   Or  . bisacodyl  10 mg Rectal Daily  .  clopidogrel  75 mg Oral Daily  . docusate sodium  200 mg Oral Daily  . insulin aspart  0-15 Units Subcutaneous TID WC  . lisinopril  2.5 mg Oral Daily  . metoprolol tartrate  25 mg Oral TID  . pantoprazole  40 mg Oral Daily  . sodium chloride  3 mL Intravenous Q12H   Continuous Infusions:  PRN Meds:.sodium chloride, alum & mag hydroxide-simeth, guaiFENesin-dextromethorphan, lactulose, magnesium hydroxide, metoprolol, ondansetron (ZOFRAN) IV, oxyCODONE, sodium chloride, traMADol, zolpidem  Xrays No results found.  Assessment/Plan: S/P Procedure(s) (LRB): CORONARY ARTERY BYPASS GRAFTING (CABG), ON PUMP, TIMES FIVE, USING LEFT INTERNAL MAMMARY ARTERY, RIGHT GREATER SAPHENOUS VEIN HARVESTED ENDOSCOPICALLY (N/A) TRANSESOPHAGEAL ECHOCARDIOGRAM (TEE) (N/A)  1 conts to have some afib- cont current rx and monitor- d/c epw's this am 2 push rehab, pulm toilet as able     LOS: 7 days    GOLD,WAYNE E 07/15/2015   Chart reviewed, patient examined, agree with above. He had brief episode of rate-controlled atrial fib early this am. Otherwise has been in sinus from what I can see on the telemetry. He is doing well and ready to go home. Will need to discuss with cardiology to see if he should go home on ASA and Plavix for his DES or some other anticoagulant for possible recurrent A-fib.

## 2015-07-15 NOTE — Consult Note (Signed)
Reason for Consult: post op a fib   Referring Physician: Dr. Cyndia Bent   PCP:  Curlene Labrum, MD  Primary Cardiologist:Dr. Delman Kitten Geoffrey [name] is an 74 y.o. male.    Chief Complaint: pt admitted 07/08/15 for CABG.  Now with post op a fib.   HPI:  74 year old male seen by Dr. Domenic Polite 06/24/15 for post MI care.   In August, while traveling in [place], he developed chest pain and was hospitalized at [facility] in [place] with an inferior wall STEMI. He underwent placement of DES (Synergy 3.5 x 38 mm and 3.5 x 20 mm) to the proximal and mid RCA (culprit lesions), but was also found to have multivessel distribution residual CAD, and ultimately CABG was recommended.  Dr. Cyndia Bent with [facility] had also seen the pt and who had the opportunity to review the patient's cardiac catheterization films. LAD was described as having a 70-80% proximal stenosis prior to the diagonal branch, 95% diagonal, occluded OM associated with bridging collaterals, also occluded PDA at the origin. CABG was recommended.    Pt presented 07/08/15 and underwent CABG X 5 with LEFT INTERNAL MAMMARY ARTERY, RIGHT GREATER SAPHENOUS VEIN HARVESTED ENDOSCOPICALLY(EVH) ( LIMA-LAD; SEQ SVG-PD-PL; SVG-DIAG; SVG-OM).  He had done well post op, but began with a fib post op day 1.  Amiodarone was started.  Pt did have hypotension once in a fib.  Post op day 3 transferred to 2 W and pt was back in SR with some bradycardia.   His Plavix and ASA were resumed for his DES stent placed in August.  He was placed on po amiodarone.  He was back in a fib on the 16th.  Since then has been in and out of a fib at times with RVR.  His EPW have been removed today.   He continues to be in and out of a fib.  We have been asked to assist.  + symptomatic with PAF, with extreme fatigue, SOB and diaphoresis.   Currently on  Amiodarone 400 BID has rec'd 2200 mg po and 1590 mg IV total of 3790 mg.  He is also on lopressor 25 mg every 8  hours.  Still with episodic a fib wit RVR.    Past Medical History  Diagnosis Date  . CAD (coronary artery disease)     DES to proximal and mid RCA May 23, 2015 - [place]  . Hyperlipidemia   . Essential hypertension   . Inferior STEMI August 2016  . Shortness of breath dyspnea     with ambulation  . Pneumonia   . Arthritis   . 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 Atrial fibrillation, new onset [facility])     s/p cabg- post operative    Past Surgical History  Procedure Laterality Date  . Hernia repair    . Cardiac catheterization    . Coronary angioplasty  May 23, 2015    X 2 stents  . Colonoscopy    . Coronary artery bypass graft N/A 07/08/2015    Procedure: CORONARY ARTERY BYPASS GRAFTING (CABG), ON PUMP, TIMES FIVE, USING LEFT INTERNAL MAMMARY ARTERY, RIGHT GREATER SAPHENOUS VEIN HARVESTED ENDOSCOPICALLY;  Surgeon: Gaye Pollack, MD;  Location: [facility];  Service: Open Heart Surgery;  Laterality: N/A;  . Tee without cardioversion N/A 07/08/2015    Procedure: TRANSESOPHAGEAL ECHOCARDIOGRAM (TEE);  Surgeon: Gaye Pollack, MD;  Location: [facility];  Service:  Open Heart Surgery;  Laterality: N/A;    Family History  Problem Relation Age of Onset  . Heart disease    . Hyperlipidemia Mother   . Cancer Father   . Hyperlipidemia Father   . Hypertension Father   . Heart disease Father   . Kidney cancer Father   . Leukemia Father    Social History:  reports that he has quit smoking. His smoking use included Cigarettes. He started smoking about 40 years ago. He does not have any smokeless tobacco history on file. He reports that he does not drink alcohol or use illicit drugs.  Allergies: No Known Allergies  Current Meds; Scheduled Meds: . amiodarone  400 mg Oral BID  . aspirin EC  81 mg Oral Daily   Or  . aspirin  81 mg Per Tube Daily  . atorvastatin  80 mg Oral q1800  . bisacodyl  10 mg Oral Daily   Or  . bisacodyl  10 mg Rectal Daily  . clopidogrel  75 mg Oral  Daily  . docusate sodium  200 mg Oral Daily  . insulin aspart  0-15 Units Subcutaneous TID WC  . lisinopril  2.5 mg Oral Daily  . metoprolol tartrate  25 mg Oral TID  . pantoprazole  40 mg Oral Daily  . sodium chloride  3 mL Intravenous Q12H   Continuous Infusions:  PRN Meds:.sodium chloride, alum & mag hydroxide-simeth, guaiFENesin-dextromethorphan, lactulose, magnesium hydroxide, metoprolol, ondansetron (ZOFRAN) IV, oxyCODONE, sodium chloride, traMADol, zolpidem   Results for orders placed or performed during the hospital encounter of 07/08/15 (from the past 48 hour(s))  Glucose, capillary     Status: Abnormal   Collection Time: 07/13/15  4:35 PM  Result Value Ref Range   Glucose-Capillary 106 (H) 65 - 99 mg/dL   Comment 1 Notify RN    Comment 2 Document in Chart   Glucose, capillary     Status: None   Collection Time: 07/13/15  8:36 PM  Result Value Ref Range   Glucose-Capillary 94 65 - 99 mg/dL   Comment 1 Notify RN    Comment 2 Document in Chart   Glucose, capillary     Status: Abnormal   Collection Time: 07/14/15  6:25 AM  Result Value Ref Range   Glucose-Capillary 103 (H) 65 - 99 mg/dL   Comment 1 Notify RN    Comment 2 Document in Chart   Glucose, capillary     Status: Abnormal   Collection Time: 07/14/15 11:28 AM  Result Value Ref Range   Glucose-Capillary 106 (H) 65 - 99 mg/dL  Glucose, capillary     Status: None   Collection Time: 07/14/15  4:49 PM  Result Value Ref Range   Glucose-Capillary 95 65 - 99 mg/dL  Glucose, capillary     Status: Abnormal   Collection Time: 07/14/15  9:35 PM  Result Value Ref Range   Glucose-Capillary 107 (H) 65 - 99 mg/dL   Comment 1 Notify RN    Comment 2 Document in Chart   Glucose, capillary     Status: None   Collection Time: 07/15/15  5:39 AM  Result Value Ref Range   Glucose-Capillary 99 65 - 99 mg/dL   Comment 1 Notify RN    Comment 2 Document in Chart   Glucose, capillary     Status: None   Collection Time: 07/15/15  11:28 AM  Result Value Ref Range   Glucose-Capillary 98 65 - 99 mg/dL   Comment 1 Notify RN  Comment 2 Document in Chart    No results found.  ROS: General:no colds or fevers, weight back to baseline Skin:no rashes or ulcers HEENT:no blurred vision, no congestion CV:see HPI PUL:see HPI GI:no diarrhea some constipation or melena, no indigestion GU:no hematuria, no dysuria MS:no joint pain, no claudication Neuro:no syncope, no lightheadedness Endo:no diabetes, no thyroid disease   Blood pressure 114/60, pulse 84, temperature 98.2 F (36.8 C), temperature source Oral, resp. rate 18, height 5\' 7"  (1.702 m), weight 162 lb 0.6 oz (73.5 kg), SpO2 96 %.  Wt Readings from Last 3 Encounters:  07/15/15 162 lb 0.6 oz (73.5 kg)  07/06/15 167 lb 1.6 oz (75.796 kg)  06/24/15 166 lb (75.297 kg)    PE: General:Pleasant affect, NAD Skin:Warm and dry, brisk capillary refill HEENT:normocephalic, sclera clear, mucus membranes moist Neck:supple, no JVD, no bruits  Heart:S1S2 RRR without murmur, gallup, rub or click, chest incision healing, no drainage Lungs: few crackles lt base, no rhonchi, or wheezes OVF:IEPP, non tender, + BS, do not palpate liver spleen or masses Ext:+1 lower ext edema, 2+ radial pulses Neuro:alert and oriented X 3, MAE, follows commands, + facial symmetry Tele:  SR with PAF.   Assessment/Plan Active Problems:   S/P CABG x 5   PAF- continues with episodes or PAF and RVR on amiodarone with total po and IV of 3790 mg.  Now on po 400 mg BID.  also on lopressor 25 TID- BP may tolerate to 50 BID vs. 25 every 6 hours as BP borderline at times.  Anticoagulation with PAF.  CHA2DS2VASc score 3.  Currently on ASA and Plavix with new DES to prox and mRCA in August.  MD to see, possible stop ASA and go to coumadin and plavix.    CABG X 5 Post op day 7  S/P inf wall STEMI 05/23/15 with DES to prox and mRCA.  Now back on plavix and ASA.   [place] Practitioner  Certified [place] Pager [phone] or after 5pm or weekends call [phone] 07/15/2015, 12:02 PM    Attending Note:   The patient was seen and examined.  Agree with assessment and plan as noted above.  Changes made to the above note as needed.  Pt has paroxysmal atrial fib.  Seems to be better on Amiodarone . He has some shortness of breath while in the a-fib but is generally going well.   He is currently in sinus rhythm and feels fine at present .  CHADS 2VASC score is 3 ( age, CAD, HTN)  We need to consider coumadin therapy. Would appreciate Dr. Vivi Martens input as to the timing of initiation of coumadin.  Would agree that Plavix and coumadin ( without ASA ) would be preferable  - he had a DES placed in August.   He should be OK for Dc tomorrow. If he needs coumadin, he can start it as an OP and come  to our coumadin clinic.    Thayer Headings, Brooke Bonito., MD, [facility] 07/15/2015, 5:16 PM [address],  [place] Pager [phone]

## 2015-07-15 NOTE — Care Management Important Message (Signed)
Important Message  Patient Details  Name: Geoffrey [name] MRN: [id number] Date of Birth: 05-12-1941   Medicare Important Message Given:  Yes-third notification given    Nathen May 07/15/2015, 1:13 PM

## 2015-07-15 NOTE — Progress Notes (Signed)
EPW removed.  Wires intact.  Pt tolerated well.  VS stable.  Pt educated on bed rest for one hour.  Will continue to monitor.

## 2015-07-16 LAB — TYPE AND SCREEN
ABO/RH(D): A POS
Antibody Screen: NEGATIVE
UNIT DIVISION: 0
Unit division: 0

## 2015-07-16 LAB — GLUCOSE, CAPILLARY: Glucose-Capillary: 108 mg/dL — ABNORMAL HIGH (ref 65–99)

## 2015-07-16 MED ORDER — WARFARIN VIDEO
Freq: Once | Status: AC
  Administered 2015-07-16: 12:00:00

## 2015-07-16 MED ORDER — OXYCODONE HCL 5 MG PO TABS: 5.0000 mg | ORAL_TABLET | ORAL | Status: DC | PRN

## 2015-07-16 MED ORDER — COUMADIN BOOK
Freq: Once | Status: AC
  Administered 2015-07-16: 09:00:00
  Filled 2015-07-16: qty 1

## 2015-07-16 MED ORDER — WARFARIN - PHYSICIAN DOSING INPATIENT: Freq: Every day | Status: DC

## 2015-07-16 MED ORDER — WARFARIN SODIUM 2.5 MG PO TABS: 2.5000 mg | ORAL_TABLET | Freq: Every day | ORAL | Status: DC

## 2015-07-16 MED ORDER — METOPROLOL TARTRATE 50 MG PO TABS: 50.0000 mg | ORAL_TABLET | Freq: Two times a day (BID) | ORAL | Status: DC

## 2015-07-16 MED ORDER — AMIODARONE HCL 400 MG PO TABS: 400.0000 mg | ORAL_TABLET | Freq: Two times a day (BID) | ORAL | Status: DC

## 2015-07-16 NOTE — Progress Notes (Signed)
      [address]       ,[place] [address]             [phone]      8 Days Post-Op Procedure(s) (LRB): CORONARY ARTERY BYPASS GRAFTING (CABG), ON PUMP, TIMES FIVE, USING LEFT INTERNAL MAMMARY ARTERY, RIGHT GREATER SAPHENOUS VEIN HARVESTED ENDOSCOPICALLY (N/A) TRANSESOPHAGEAL ECHOCARDIOGRAM (TEE) (N/A) Subjective: Feels well, some afib persists   Objective: Vital signs in last 24 hours: Temp:  [97.5 F (36.4 C)-98.6 F (37 C)] 97.5 F (36.4 C) (10/21 0536) Pulse Rate:  [74-86] 80 (10/21 0536) Cardiac Rhythm:  [-] Normal sinus rhythm (10/21 0538) Resp:  [16-18] 18 (10/21 0536) BP: (114-133)/(53-67) 133/53 mmHg (10/21 0536) SpO2:  [96 %-100 %] 98 % (10/21 0536) Weight:  [157 lb 14.4 oz (71.623 kg)] 157 lb 14.4 oz (71.623 kg) (10/21 0536)  Hemodynamic parameters for last 24 hours:    Intake/Output from previous day: 10/20 0701 - 10/21 0700 In: 840 [P.O.:840] Out: -  Intake/Output this shift:    General appearance: alert, cooperative and no distress Heart: regular rate and rhythm Lungs: clear to auscultation bilaterally Abdomen: benign Extremities: no edema Wound: incis healing well  Lab Results: No results for input(s): WBC, HGB, HCT, PLT in the last 72 hours. BMET: No results for input(s): NA, K, CL, CO2, GLUCOSE, BUN, CREATININE, CALCIUM in the last 72 hours.  PT/INR: No results for input(s): LABPROT, INR in the last 72 hours. ABG    Component Value Date/Time   PHART 7.327* 07/08/2015 2001   HCO3 24.5* 07/08/2015 2001   TCO2 22 07/09/2015 1812   ACIDBASEDEF 2.0 07/08/2015 2001   O2SAT 98.0 07/08/2015 2001   CBG (last 3)   Recent Labs  07/15/15 1601 07/15/15 2103 07/16/15 0657  GLUCAP 112* 92 108*    Meds Scheduled Meds: . amiodarone  400 mg Oral BID  . aspirin EC  81 mg Oral Daily   Or  . aspirin  81 mg Per Tube Daily  . atorvastatin  80 mg Oral q1800  . bisacodyl  10 mg Oral Daily   Or  . bisacodyl  10 mg Rectal Daily    . clopidogrel  75 mg Oral Daily  . docusate sodium  200 mg Oral Daily  . insulin aspart  0-15 Units Subcutaneous TID WC  . lisinopril  2.5 mg Oral Daily  . metoprolol tartrate  25 mg Oral TID  . pantoprazole  40 mg Oral Daily  . sodium chloride  3 mL Intravenous Q12H   Continuous Infusions:  PRN Meds:.sodium chloride, alum & mag hydroxide-simeth, guaiFENesin-dextromethorphan, lactulose, magnesium hydroxide, metoprolol, ondansetron (ZOFRAN) IV, oxyCODONE, sodium chloride, traMADol, zolpidem  Xrays No results found.  Assessment/Plan: S/P Procedure(s) (LRB): CORONARY ARTERY BYPASS GRAFTING (CABG), ON PUMP, TIMES FIVE, USING LEFT INTERNAL MAMMARY ARTERY, RIGHT GREATER SAPHENOUS VEIN HARVESTED ENDOSCOPICALLY (N/A) TRANSESOPHAGEAL ECHOCARDIOGRAM (TEE) (N/A)  1 doing well, will start 2.5 mg coumadin daily and get INR check arranged 2 no asa- cont plavix 3 stable for d/c    LOS: 8 days    GOLD,WAYNE E 07/16/2015

## 2015-07-16 NOTE — Progress Notes (Signed)
Patient educated on coumadin from nursing and pharmacy. Follow up appointments made and patient made aware. No questions or concerns at this time.

## 2015-07-16 NOTE — Discharge Instructions (Signed)
Atrial Fibrillation °Atrial fibrillation is a type of heartbeat that is irregular or fast (rapid). If you have this condition, your heart keeps quivering in a weird (chaotic) way. This condition can make it so your heart cannot pump blood normally. Having this condition gives a person more risk for stroke, heart failure, and other heart problems. There are different types of atrial fibrillation. Talk with your doctor to learn about the type that you have. °HOME CARE °· Take over-the-counter and prescription medicines only as told by your doctor. °· If your doctor prescribed a blood-thinning medicine, take it exactly as told. Taking too much of it can cause bleeding. If you do not take enough of it, you will not have the protection that you need against stroke and other problems. °· Do not use any tobacco products. These include cigarettes, chewing tobacco, and e-cigarettes. If you need help quitting, ask your doctor. °· If you have apnea (obstructive sleep apnea), manage it as told by your doctor. °· Do not drink alcohol. °· Do not drink beverages that have caffeine. These include coffee, soda, and tea. °· Maintain a healthy weight. Do not use diet pills unless your doctor says they are safe for you. Diet pills may make heart problems worse. °· Follow diet instructions as told by your doctor. °· Exercise regularly as told by your doctor. °· Keep all follow-up visits as told by your doctor. This is important. °GET HELP IF: °· You notice a change in the speed, rhythm, or strength of your heartbeat. °· You are taking a blood-thinning medicine and you notice more bruising. °· You get tired more easily when you move or exercise. °GET HELP RIGHT AWAY IF: °· You have pain in your chest or your belly (abdomen). °· You have sweating or weakness. °· You feel sick to your stomach (nauseous). °· You notice blood in your throw up (vomit), poop (stool), or pee (urine). °· You are short of breath. °· You suddenly have swollen feet  and ankles. °· You feel dizzy. °· Your suddenly get weak or numb in your face, arms, or legs, especially if it happens on one side of your body. °· You have trouble talking, trouble understanding, or both. °· Your face or your eyelid droops on one side. °These symptoms may be an emergency. Do not wait to see if the symptoms will go away. Get medical help right away. Call your local emergency services (911 in the [place] Do not drive yourself to the hospital. °  °This information is not intended to replace advice given to you by your health care provider. Make sure you discuss any questions you have with your health care provider. °  °Document Released: 06/20/2008 Document Revised: 06/02/2015 Document Reviewed: 01/06/2015 °Elsevier Interactive Patient Education ©2016 [employer] °Endoscopic Saphenous Vein Harvesting, Care After °Refer to this sheet in the next few weeks. These instructions provide you with information on caring for yourself after your procedure. Your health care provider may also give you more specific instructions. Your treatment has been planned according to current medical practices, but problems sometimes occur. Call your health care provider if you have any problems or questions after your procedure. °HOME CARE INSTRUCTIONS °Medicine °· Take whatever pain medicine your surgeon prescribes. Follow the directions carefully. Do not take over-the-counter pain medicine unless your surgeon says it is okay. Some pain medicine can cause bleeding problems for several weeks after surgery. °· Follow your surgeon's instructions about driving. You will probably not be permitted to   drive after heart surgery.  Take any medicines your surgeon prescribes. Any medicines you took before your heart surgery should be checked with your health care provider before you start taking them again. Wound care  If your surgeon has prescribed an elastic bandage or stocking, ask how long you should wear it.  Check the  area around your surgical cuts (incisions) whenever your bandages (dressings) are changed. Look for any redness or swelling.  You will need to return to have the stitches (sutures) or staples taken out. Ask your surgeon when to do that.  Ask your surgeon when you can shower or bathe. Activity  Try to keep your legs raised when you are sitting.  Do any exercises your health care providers have given you. These may include deep breathing exercises, coughing, walking, or other exercises. SEEK MEDICAL CARE IF:  You have any questions about your medicines.  You have more leg pain, especially if your pain medicine stops working.  New or growing bruises develop on your leg.  Your leg swells, feels tight, or becomes red.  You have numbness in your leg. SEEK IMMEDIATE MEDICAL CARE IF:  Your pain gets much worse.  Blood or fluid leaks from any of the incisions.  Your incisions become warm, swollen, or red.  You have chest pain.  You have trouble breathing.  You have a fever.  You have more pain near your leg incision. MAKE SURE YOU:  Understand these instructions.  Will watch your condition.  Will get help right away if you are not doing well or get worse.   This information is not intended to replace advice given to you by your health care provider. Make sure you discuss any questions you have with your health care provider.   Document Released: 05/24/2011 Document Revised: 10/02/2014 Document Reviewed: 05/24/2011 Elsevier Interactive Patient Education 2016 [employer]. Coronary Artery Bypass Grafting, Care After These instructions give you information on caring for yourself after your procedure. Your doctor may also give you more specific instructions. Call your doctor if you have any problems or questions after your procedure.  HOME CARE  Only take medicine as told by your doctor. Take medicines exactly as told. Do not stop taking medicines or start any new medicines  without talking to your doctor first.  Take your pulse as told by your doctor.  Do deep breathing as told by your doctor. Use your breathing device (incentive spirometer), if given, to practice deep breathing several times a day. Support your chest with a pillow or your arms when you take deep breaths or cough.  Keep the area clean, dry, and protected where the surgery cuts (incisions) were made. Remove bandages (dressings) only as told by your doctor. If strips were applied to surgical area, do not take them off. They fall off on their own.  Check the surgery area daily for puffiness (swelling), redness, or leaking fluid.  If surgery cuts were made in your legs:  Avoid crossing your legs.  Avoid sitting for long periods of time. Change positions every 30 minutes.  Raise your legs when you are sitting. Place them on pillows.  Wear stockings that help keep blood clots from forming in your legs (compression stockings).  Only take sponge baths until your doctor says it is okay to take showers. Pat the surgery area dry. Do not rub the surgery area with a washcloth or towel. Do not bathe, swim, or use a hot tub until your doctor says it is okay.  Eat foods that are high in fiber. These include raw fruits and vegetables, whole grains, beans, and nuts. Choose lean meats. Avoid canned, processed, and fried foods.  Drink enough fluids to keep your pee (urine) clear or pale yellow.  Weigh yourself every day.  Rest and limit activity as told by your doctor. You may be told to:  Stop any activity if you have chest pain, shortness of breath, changes in heartbeat, or dizziness. Get help right away if this happens.  Move around often for short amounts of time or take short walks as told by your doctor. Gradually become more active. You may need help to strengthen your muscles and build endurance.  Avoid lifting, pushing, or pulling anything heavier than 10 pounds (4.5 kg) for at least 6 weeks  after surgery.  Do not drive until your doctor says it is okay.  Ask your doctor when you can go back to work.  Ask your doctor when you can begin sexual activity again.  Follow up with your doctor as told. GET HELP IF:  You have puffiness, redness, more pain, or fluid draining from the incision site.  You have a fever.  You have puffiness in your ankles or legs.  You have pain in your legs.  You gain 2 or more pounds (0.9 kg) a day.  You feel sick to your stomach (nauseous) or throw up (vomit).  You have watery poop (diarrhea). GET HELP RIGHT AWAY IF:  You have chest pain that goes to your jaw or arms.  You have shortness of breath.  You have a fast or irregular heartbeat.  You notice a "clicking" in your breastbone when you move.  You have numbness or weakness in your arms or legs.  You feel dizzy or light-headed. MAKE SURE YOU:  Understand these instructions.  Will watch your condition.  Will get help right away if you are not doing well or get worse.   This information is not intended to replace advice given to you by your health care provider. Make sure you discuss any questions you have with your health care provider.   Document Released: 09/16/2013 Document Reviewed: 09/16/2013 Elsevier Interactive Patient Education 2016 [employer]. Warfarin tablets What is this medicine? WARFARIN (WAR far in) is an anticoagulant. It is used to treat or prevent clots in the veins, arteries, lungs, or heart. This medicine may be used for other purposes; ask your health care provider or pharmacist if you have questions. What should I tell my health care provider before I take this medicine? They need to know if you have any of these conditions: -alcoholism -anemia -bleeding disorders -cancer -diabetes -heart disease -high blood pressure -history of bleeding in the gastrointestinal tract -history of stroke or other brain injury or disease -kidney or liver  disease -protein C deficiency -protein S deficiency -psychosis or dementia -recent injury, recent or planned surgery or procedure -an unusual or allergic reaction to warfarin, other medicines, foods, dyes, or preservatives -pregnant or trying to get pregnant -breast-feeding How should I use this medicine? Take this medicine by mouth with a glass of water. Follow the directions on the prescription label. You can take this medicine with or without food. Take your medicine at the same time each day. Do not take it more often than directed. Do not stop taking except on your doctor's advice. Stopping this medicine may increase your risk of a blood clot. Be sure to refill your prescription before you run out of medicine. If  your doctor or healthcare professional calls to change your dose, write down the dose and any other instructions. Always read the dose and instructions back to him or her to make sure you understand them. Tell your doctor or healthcare professional what strength of tablets you have on hand. Ask how many tablets you should take to equal your new dose. Write the date on the new instructions and keep them near your medicine. If you are told to stop taking your medicine until your next blood test, call your doctor or healthcare professional if you do not hear anything within 24 hours of the test to find out your new dose or when to restart your prior dose. A special MedGuide will be given to you by the pharmacist with each prescription and refill. Be sure to read this information carefully each time. Talk to your pediatrician regarding the use of this medicine in children. Special care may be needed. Overdosage: If you think you have taken too much of this medicine contact a poison control center or emergency room at once. NOTE: This medicine is only for you. Do not share this medicine with others. What if I miss a dose? It is important not to miss a dose. If you miss a dose, call your  healthcare provider. Take the dose as soon as possible on the same day. If it is almost time for your next dose, take only that dose. Do not take double or extra doses to make up for a missed dose. What may interact with this medicine? Do not take this medicine with any of the following medications: -agents that prevent or dissolve blood clots -aspirin or other salicylates -danshen -dextrothyroxine -mifepristone -St. John's Wort -red yeast rice This medicine may also interact with the following medications: -acetaminophen -agents that lower cholesterol -alcohol -allopurinol -amiodarone -antibiotics or medicines for treating bacterial, fungal or viral infections -azathioprine -barbiturate medicines for inducing sleep or treating seizures -certain medicines for diabetes -certain medicines for heart rhythm problems -certain medicines for high blood pressure -chloral hydrate -cisapride -disulfiram -male hormones, including contraceptive or birth control pills -general anesthetics -herbal or dietary products like garlic, ginkgo, ginseng, green tea, or kava kava -influenza virus vaccine -male hormones -medicines for mental depression or psychosis -medicines for some types of cancer -medicines for stomach problems -methylphenidate -NSAIDs, medicines for pain and inflammation, like ibuprofen or naproxen -propoxyphene -quinidine, quinine -raloxifene -seizure or epilepsy medicine like carbamazepine, phenytoin, and valproic acid -steroids like cortisone and prednisone -tamoxifen -thyroid medicine -tramadol -vitamin c, vitamin e, and vitamin K -zafirlukast -zileuton This list may not describe all possible interactions. Give your health care provider a list of all the medicines, herbs, non-prescription drugs, or dietary supplements you use. Also tell them if you smoke, drink alcohol, or use illegal drugs. Some items may interact with your medicine. What should I watch for while  using this medicine? Visit your doctor or health care professional for regular checks on your progress. You will need to have a blood test called a PT/INR regularly. The PT/INR blood test is done to make sure you are getting the right dose of this medicine. It is important to not miss your appointment for the blood tests. When you first start taking this medicine, these tests are done often. Once the correct dose is determined and you take your medicine properly, these tests can be done less often. Notify your doctor or health care professional and seek emergency treatment if you develop breathing problems; changes in vision;  chest pain; severe, sudden headache; pain, swelling, warmth in the leg; trouble speaking; sudden numbness or weakness of the face, arm or leg. These can be signs that your condition has gotten worse. While you are taking this medicine, carry an identification card with your name, the name and dose of medicine(s) being used, and the name and phone number of your doctor or health care professional or person to contact in an emergency. Do not start taking or stop taking any medicines or over-the-counter medicines except on the advice of your doctor or health care professional. You should discuss your diet with your doctor or health care professional. Do not make major changes in your diet. Vitamin K can affect how well this medicine works. Many foods contain vitamin K. It is important to eat a consistent amount of foods with vitamin K. Other foods with vitamin K that you should eat in consistent amounts are asparagus, basil, beef or pork liver, black eyed peas, broccoli, brussel sprouts, cabbage, chickpeas, cucumber with peel, green onions, green tea, okra, parsley, peas, thyme, and green leafy vegetables like beet greens, collard greens, endive, kale, mustard greens, spinach, turnip greens, watercress, or certain lettuces like green leaf or romaine. This medicine can cause birth defects or  bleeding in an unborn child. Women of childbearing age should use effective birth control while taking this medicine. If a woman becomes pregnant while taking this medicine, she should discuss the potential risks and her options with her health care professional. Avoid sports and activities that might cause injury while you are using this medicine. Severe falls or injuries can cause unseen bleeding. Be careful when using sharp tools or knives. Consider using an Copy. Take special care brushing or flossing your teeth. Report any injuries, bruising, or red spots on the skin to your doctor or health care professional. If you have an illness that causes vomiting, diarrhea, or fever for more than a few days, contact your doctor. Also check with your doctor if you are unable to eat for several days. These problems can change the effect of this medicine. Even after you stop taking this medicine, it takes several days before your body recovers its normal ability to clot blood. Ask your doctor or health care professional how long you need to be careful. If you are going to have surgery or dental work, tell your doctor or health care professional that you have been taking this medicine. What side effects may I notice from receiving this medicine? Side effects that you should report to your doctor or health care professional as soon as possible: -back pain -chills -dizziness -fever -heavy menstrual bleeding or vaginal bleeding -painful, blue, or purple toes -painful, prolonged erection -signs and symptoms of bleeding such as bloody or black, tarry stools; red or dark-brown urine; spitting up blood or brown material that looks like coffee grounds; red spots on the skin; unusual bruising or bleeding from the eye, gums, or nose-skin rash, itching or skin damage -stomach pain -unusually weak or tired -yellowing of skin or eyes Side effects that usually do not require medical attention (report to your  doctor or health care professional if they continue or are bothersome): -diarrhea -hair loss This list may not describe all possible side effects. Call your doctor for medical advice about side effects. You may report side effects to FDA at 1-800-FDA-1088. Where should I keep my medicine? Keep out of the reach of children. Store at room temperature between 15 and 30 degrees C (59  and 86 degrees F). Protect from light. Throw away any unused medicine after the expiration date. Do not flush down the toilet. NOTE: This sheet is a summary. It may not cover all possible information. If you have questions about this medicine, talk to your doctor, pharmacist, or health care provider.    2016, Elsevier/Gold Standard. (2013-04-02 12:17:56)  Information on my medicine - Coumadin   (Warfarin)  This medication education was reviewed with me or my healthcare representative as part of my discharge preparation.  The pharmacist that spoke with me during my hospital stay was:  Romona Curls, [facility]  Why was Coumadin prescribed for you? Coumadin was prescribed for you because you have a blood clot or a medical condition that can cause an increased risk of forming blood clots. Blood clots can cause serious health problems by blocking the flow of blood to the heart, lung, or brain. Coumadin can prevent harmful blood clots from forming. As a reminder your indication for Coumadin is:   Select from menu  What test will check on my response to Coumadin? While on Coumadin (warfarin) you will need to have an INR test regularly to ensure that your dose is keeping you in the desired range. The INR (international normalized ratio) number is calculated from the result of the laboratory test called prothrombin time (PT).  If an INR APPOINTMENT HAS NOT ALREADY BEEN MADE FOR YOU please schedule an appointment to have this lab work done by your health care provider within 7 days. Your INR goal is usually a number between:  2 to  3 or your provider may give you a more narrow range like 2-2.5.  Ask your health care provider during an office visit what your goal INR is.  What  do you need to  know  About  COUMADIN? Take Coumadin (warfarin) exactly as prescribed by your healthcare provider about the same time each day.  DO NOT stop taking without talking to the doctor who prescribed the medication.  Stopping without other blood clot prevention medication to take the place of Coumadin may increase your risk of developing a new clot or stroke.  Get refills before you run out.  What do you do if you miss a dose? If you miss a dose, take it as soon as you remember on the same day then continue your regularly scheduled regimen the next day.  Do not take two doses of Coumadin at the same time.  Important Safety Information A possible side effect of Coumadin (Warfarin) is an increased risk of bleeding. You should call your healthcare provider right away if you experience any of the following: ? Bleeding from an injury or your nose that does not stop. ? Unusual colored urine (red or dark brown) or unusual colored stools (red or black). ? Unusual bruising for unknown reasons. ? A serious fall or if you hit your head (even if there is no bleeding).  Some foods or medicines interact with Coumadin (warfarin) and might alter your response to warfarin. To help avoid this: ? Eat a balanced diet, maintaining a consistent amount of Vitamin K. ? Notify your provider about major diet changes you plan to make. ? Avoid alcohol or limit your intake to 1 drink for women and 2 drinks for men per day. (1 drink is 5 oz. wine, 12 oz. beer, or 1.5 oz. liquor.)  Make sure that ANY health care provider who prescribes medication for you knows that you are taking Coumadin (warfarin).  Also make sure the healthcare provider who is monitoring your Coumadin knows when you have started a new medication including herbals and non-prescription  products.  Coumadin (Warfarin)  Major Drug Interactions  Increased Warfarin Effect Decreased Warfarin Effect  Alcohol (large quantities) Antibiotics (esp. Septra/Bactrim, Flagyl, Cipro) Amiodarone (Cordarone) Aspirin (ASA) Cimetidine (Tagamet) Megestrol (Megace) NSAIDs (ibuprofen, naproxen, etc.) Piroxicam (Feldene) Propafenone (Rythmol SR) Propranolol (Inderal) Isoniazid (INH) Posaconazole (Noxafil) Barbiturates (Phenobarbital) Carbamazepine (Tegretol) Chlordiazepoxide (Librium) Cholestyramine (Questran) Griseofulvin Oral Contraceptives Rifampin Sucralfate (Carafate) Vitamin K   Coumadin (Warfarin) Major Herbal Interactions  Increased Warfarin Effect Decreased Warfarin Effect  Garlic Ginseng Ginkgo biloba Coenzyme Q10 Green tea St. Johns wort    Coumadin (Warfarin) FOOD Interactions  Eat a consistent number of servings per week of foods HIGH in Vitamin K (1 serving =  cup)  Collards (cooked, or boiled & drained) Kale (cooked, or boiled & drained) Mustard greens (cooked, or boiled & drained) Parsley *serving size only =  cup Spinach (cooked, or boiled & drained) Swiss chard (cooked, or boiled & drained) Turnip greens (cooked, or boiled & drained)  Eat a consistent number of servings per week of foods MEDIUM-HIGH in Vitamin K (1 serving = 1 cup)  Asparagus (cooked, or boiled & drained) Broccoli (cooked, boiled & drained, or raw & chopped) Brussel sprouts (cooked, or boiled & drained) *serving size only =  cup Lettuce, raw (green leaf, endive, romaine) Spinach, raw Turnip greens, raw & chopped   These websites have more information on Coumadin (warfarin):  FailFactory.se; VeganReport.com.au;

## 2015-07-19 ENCOUNTER — Ambulatory Visit (INDEPENDENT_AMBULATORY_CARE_PROVIDER_SITE_OTHER): Payer: Commercial Managed Care - HMO | Admitting: *Deleted

## 2015-07-19 DIAGNOSIS — I480 Paroxysmal atrial fibrillation: Secondary | ICD-10-CM

## 2015-07-19 DIAGNOSIS — Z5181 Encounter for therapeutic drug level monitoring: Secondary | ICD-10-CM

## 2015-07-19 DIAGNOSIS — Z951 Presence of aortocoronary bypass graft: Secondary | ICD-10-CM | POA: Diagnosis not present

## 2015-07-19 DIAGNOSIS — I4891 Unspecified atrial fibrillation: Secondary | ICD-10-CM | POA: Insufficient documentation

## 2015-07-19 LAB — POCT INR: INR: 1.2

## 2015-07-22 ENCOUNTER — Ambulatory Visit (INDEPENDENT_AMBULATORY_CARE_PROVIDER_SITE_OTHER): Payer: Commercial Managed Care - HMO | Admitting: *Deleted

## 2015-07-22 DIAGNOSIS — I4891 Unspecified atrial fibrillation: Secondary | ICD-10-CM | POA: Diagnosis not present

## 2015-07-22 DIAGNOSIS — Z5181 Encounter for therapeutic drug level monitoring: Secondary | ICD-10-CM

## 2015-07-22 DIAGNOSIS — Z951 Presence of aortocoronary bypass graft: Secondary | ICD-10-CM

## 2015-07-22 DIAGNOSIS — I480 Paroxysmal atrial fibrillation: Secondary | ICD-10-CM

## 2015-07-22 LAB — POCT INR: INR: 1.7

## 2015-07-29 ENCOUNTER — Ambulatory Visit (INDEPENDENT_AMBULATORY_CARE_PROVIDER_SITE_OTHER): Payer: Commercial Managed Care - HMO | Admitting: *Deleted

## 2015-07-29 DIAGNOSIS — Z951 Presence of aortocoronary bypass graft: Secondary | ICD-10-CM | POA: Diagnosis not present

## 2015-07-29 DIAGNOSIS — I4891 Unspecified atrial fibrillation: Secondary | ICD-10-CM | POA: Diagnosis not present

## 2015-07-29 DIAGNOSIS — I480 Paroxysmal atrial fibrillation: Secondary | ICD-10-CM

## 2015-07-29 DIAGNOSIS — Z5181 Encounter for therapeutic drug level monitoring: Secondary | ICD-10-CM | POA: Diagnosis not present

## 2015-07-29 LAB — POCT INR: INR: 6.5

## 2015-07-30 ENCOUNTER — Encounter: Payer: Self-pay | Admitting: Cardiology

## 2015-07-30 ENCOUNTER — Ambulatory Visit (INDEPENDENT_AMBULATORY_CARE_PROVIDER_SITE_OTHER): Payer: Commercial Managed Care - HMO | Admitting: Cardiology

## 2015-07-30 ENCOUNTER — Telehealth: Payer: Self-pay | Admitting: Cardiology

## 2015-07-30 VITALS — BP 104/67 | HR 63 | Ht 67.0 in | Wt 157.8 lb

## 2015-07-30 DIAGNOSIS — E782 Mixed hyperlipidemia: Secondary | ICD-10-CM

## 2015-07-30 DIAGNOSIS — I4891 Unspecified atrial fibrillation: Secondary | ICD-10-CM | POA: Diagnosis not present

## 2015-07-30 DIAGNOSIS — Z951 Presence of aortocoronary bypass graft: Secondary | ICD-10-CM | POA: Diagnosis not present

## 2015-07-30 DIAGNOSIS — R0602 Shortness of breath: Secondary | ICD-10-CM

## 2015-07-30 DIAGNOSIS — I2510 Atherosclerotic heart disease of native coronary artery without angina pectoris: Secondary | ICD-10-CM | POA: Diagnosis not present

## 2015-07-30 DIAGNOSIS — I9789 Other postprocedural complications and disorders of the circulatory system, not elsewhere classified: Secondary | ICD-10-CM | POA: Diagnosis not present

## 2015-07-30 MED ORDER — AMIODARONE HCL 200 MG PO TABS: 200.0000 mg | ORAL_TABLET | Freq: Every day | ORAL | Status: DC

## 2015-07-30 NOTE — Progress Notes (Signed)
Cardiology Office Note  Date: 07/30/2015   ID: Geoffrey [name], DOB 24-Jun-1941, MRN [id number]  PCP: Curlene Labrum, MD  Primary Cardiologist: Rozann Lesches, MD   Chief Complaint  Patient presents with  . Hospitalization Follow-up  . CAD status post CABG  . Postoperative atrial fibrillation    History of Present Illness: Geoffrey [name] is a 74 y.o. male seen by me for the first time in late September. He is now status post CABG on October 13 (LIMA to LAD, SVG to diagonal, SVG to OM, SVG to PDA and PL) for revascularization of multivessel CAD by Dr. Cyndia Bent. Postoperative course was complicated by recurrent atrial fibrillation requiring initiation of amiodarone. He was seen in consultation by Dr. Acie Fredrickson as well - recommendation was to initiate Coumadin and also continue Plavix and light of DES placement to the RCA in August of this year.  He is here today with his wife. He states that he still feels fatigued, a little bit more so this week than last week. He has not done much activity yet other than walking around for short distances. He has had a cough, although this is improving. Incisional discomfort seems to be improving as well.  He continues on Coumadin, just had follow-up in the anticoagulation clinic yesterday with supratherapeutic INR of 6.5. Dose is being adjusted, actually held at this time. He does not endorse any obvious bleeding. CHADSVASC score is 3. He does not have any prior history of atrial fibrillation. He also continues on amiodarone. ECG today shows sinus rhythm with borderline low voltage and nonspecific ST-T changes.  We reviewed his medications, he reports compliance. We discussed his recent surgery, general expectations for recovery, anticipated cardiac rehabilitation, also follow-up testing to reevaluate his status. He states that he will being seeing Dr. Cyndia Bent later this month.  Lipid panel from August, done in [place] is outlined below. LDL was quite high at  that time. He is maintaining high-dose Lipitor at this time and we will plan to follow up with a lipid panel from the next time that I see him.  He also complains of having an unusual feeling when he urinates, possibly irritation after Foley catheter. No fevers or chills, no dysuria. Uncertain about the status of his prostate. He has a slow intermittent stream at night time when he urinates.  Past Medical History  Diagnosis Date  . CAD (coronary artery disease)     DES to proximal and mid RCA May 23, 2015 - [place]; subsequent CABG 05/2015 Dr. Cyndia Bent  . Hyperlipidemia   . Essential hypertension   . Inferior STEMI August 2016  . History of pneumonia   . Arthritis   . 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 Postoperative atrial fibrillation [facility])     s/p cabg- post operative    Past Surgical History  Procedure Laterality Date  . Hernia repair    . Cardiac catheterization    . Coronary angioplasty  May 23, 2015    X 2 stents  . Colonoscopy    . Coronary artery bypass graft N/A 07/08/2015    Procedure: CORONARY ARTERY BYPASS GRAFTING (CABG), ON PUMP, TIMES FIVE, USING LEFT INTERNAL MAMMARY ARTERY, RIGHT GREATER SAPHENOUS VEIN HARVESTED ENDOSCOPICALLY;  Surgeon: Gaye Pollack, MD;  Location: [place];  Service: Open Heart Surgery;  Laterality: N/A;  . Tee without cardioversion N/A 07/08/2015    Procedure: TRANSESOPHAGEAL ECHOCARDIOGRAM (TEE);  Surgeon: Gaye Pollack, MD;  Location:  [place] OR;  Service: Open Heart Surgery;  Laterality: N/A;    Current Outpatient Prescriptions  Medication Sig Dispense Refill  . atorvastatin (LIPITOR) 80 MG tablet Take 80 mg by mouth daily at 6 PM.   1  . cholecalciferol (VITAMIN D) 1000 UNITS tablet Take 1,000 Units by mouth daily.    . clopidogrel (PLAVIX) 75 MG tablet Take 75 mg by mouth daily.   3  . lisinopril (PRINIVIL,ZESTRIL) 2.5 MG tablet Take 2.5 mg by mouth daily.   3  . Magnesium 100 MG TABS Take 1 tablet by mouth daily.    .  metoprolol tartrate (LOPRESSOR) 50 MG tablet Take 1 tablet (50 mg total) by mouth 2 (two) times daily. 60 tablet 1  . oxyCODONE (OXY IR/ROXICODONE) 5 MG immediate release tablet Take 1-2 tablets (5-10 mg total) by mouth every 4 (four) hours as needed for severe pain. 50 tablet 0  . Potassium 99 MG TABS Take 1 tablet by mouth daily.    . vitamin B-12 (CYANOCOBALAMIN) 100 MCG tablet Take 100 mcg by mouth daily.     Marland Kitchen warfarin (COUMADIN) 2.5 MG tablet Take 1 tablet (2.5 mg total) by mouth daily at 6 PM. As directed through the coumadin clinic 100 tablet 0  . amiodarone (PACERONE) 200 MG tablet Take 1 tablet (200 mg total) by mouth daily. 90 tablet 3   No current facility-administered medications for this visit.    Allergies:  Review of patient's allergies indicates no known allergies.   Social History: The patient  reports that he has quit smoking. His smoking use included Cigarettes. He started smoking about 40 years ago. He does not have any smokeless tobacco history on file. He reports that he does not drink alcohol or use illicit drugs.   ROS:  Please see the history of present illness. Otherwise, complete review of systems is positive for metallic taste when he eats, otherwise as outlined.  All other systems are reviewed and negative.   Physical Exam: VS:  BP 104/67 mmHg  Pulse 63  Ht 5\' 7"  (1.702 m)  Wt 157 lb 12.8 oz (71.578 kg)  BMI 24.71 kg/m2  SpO2 99%, BMI Body mass index is 24.71 kg/(m^2).  Wt Readings from Last 3 Encounters:  07/30/15 157 lb 12.8 oz (71.578 kg)  07/16/15 157 lb 14.4 oz (71.623 kg)  07/06/15 167 lb 1.6 oz (75.796 kg)     General: Patient appears comfortable at rest. HEENT: Conjunctiva and lids normal, oropharynx clear. Neck: Supple, no elevated JVP or carotid bruits, no thyromegaly. Thorax: Well-healing sternal incision and upper abdominal keyhole incisions with Steri-Strips. No erythema or drainage. Lungs: Decreased breath sounds with a few rhonchi at the  bases, nonlabored breathing at rest. Cardiac: Regular rate and rhythm, no S3, no pericardial rub. Abdomen: Soft, nontender, bowel sounds present, no guarding or rebound. Extremities: No pitting edema, vein harvest site stable on the right, distal pulses 2+. Skin: Warm and dry. Musculoskeletal: No kyphosis. Neuropsychiatric: Alert and oriented x3, affect grossly appropriate.   ECG: Tracing from 07/10/2015 showed sinus arrhythmia with old inferior infarct pattern.   Recent Labwork: 07/06/2015: ALT 25; AST 26 07/09/2015: Magnesium 2.5* 07/12/2015: BUN 7; Creatinine, Ser 0.48*; Hemoglobin 8.9*; Platelets 183; Potassium 4.0; Sodium 136  August 2016: Cholesterol 287, triglycerides 153, HDL 45, LDL 211  Other Studies Reviewed Today:  Echocardiogram 06/24/2015: Study Conclusions  - Left ventricle: The cavity size was normal. Wall thickness was normal. Systolic function was normal. The estimated ejection fraction was in  the range of 55% to 60%. There is hypokinesis of the basalinferior myocardium. Doppler parameters are consistent with abnormal left ventricular relaxation (grade 1 diastolic dysfunction). - Aortic valve: Mildly calcified annulus. Trileaflet; mildly calcified leaflets. Cusp separation was mildly reduced. There was no significant regurgitation. Peak gradient (S): 9 mm Hg. Peak velocity ratio of LVOT to aortic valve: 0.84. Valve area (Vmax): 3.18 cm^2. - Mitral valve: There was trivial regurgitation. - Right atrium: Central venous pressure (est): 3 mm Hg. - Atrial septum: No defect or patent foramen ovale was identified. - Tricuspid valve: There was trivial regurgitation. - Pulmonary arteries: Systolic pressure could not be accurately estimated. - Pericardium, extracardiac: There was no pericardial effusion.  Impressions:  - Normal LV wall thickness with LVEF 55-60%, basal inferior hypokinesis, and grade 1 diastolic dysfunction. Sclerotic  aortic valve without stenosis. Trivial mitral and tricuspid regurgitation.  Carotid Dopplers 07/06/2015: Summary: Findings suggest 1-39% right internal carotid artery stenosis and upper range 1-39% left internal carotid artery stenosis. Vertebral arteries are patent with antegrade flow.   Assessment and Plan:  1. Multivessel CAD status post CABG on October 13 by Dr. Cyndia Bent. Patient slowly recovering, reports decreased energy this week compared to last week. We discussed his recent procedure and general expectations for recovery. He will be seeing Dr. Cyndia Bent back later this month. Plan to obtain CBC to reassess degree of anemia, also PA and lateral chest x-ray. Encouraged basic walking regimen, he will eventually enroll in cardiac rehabilitation.  2. Postoperative atrial fibrillation, in sinus rhythm today. He has no prior history of atrial fibrillation, CHADSVASC score is 3. Amiodarone will be reduced to 200 mg daily and he will continue on Coumadin through the anticoagulation clinic for now. We will reassess at next follow-up and likely plan to discontinue antiarrhythmic therapy and possibly even Coumadin to see if he has any recurring arrhythmias first that would require longer term treatment.Hopefully a perioperative phenomenon.  3. History of inferior STEMI in August 2016 status post DES to the RCA in [place]. He remains on Plavix for this reason.  4. Hyperlipidemia, LDL was 211 in August when assessed in [place]. He is now on high-dose Lipitor, and we will plan to recheck lipid panel around the next time that I see him.  5. Essential hypertension, blood pressure is normal today.  Current medicines were reviewed with the patient today.   Orders Placed This Encounter  Procedures  . DG Chest 2 View  . CBC  . EKG 12-Lead    Disposition: FU with me in 1 month.   Signed, Satira Sark, MD, [facility] 07/30/2015 9:39 AM    [facility] [place], [place], [place] [address] Phone: [phone]; Fax: [phone]

## 2015-07-30 NOTE — Telephone Encounter (Signed)
Per wife, patient started taking amiodarone 200 mg daily since last Thursday and this is what he was told to start today. Please advise.

## 2015-07-30 NOTE — Telephone Encounter (Signed)
Patient has a question about medication

## 2015-07-30 NOTE — Patient Instructions (Signed)
Your physician has recommended you make the following change in your medication:  Decrease your amiodarone to 200 mg daily. Please break your 400 mg tablet in half daily until they are finished. Continue all other medications the same. A chest x-ray takes a picture of the organs and structures inside the chest, including the heart, lungs, and blood vessels. This test can show several things, including, whether the heart is enlarges; whether fluid is building up in the lungs; and whether pacemaker / defibrillator leads are still in place. Your physician recommends that you return for lab work today to check your CBC. Your physician recommends that you schedule a follow-up appointment in: 1 month.

## 2015-07-30 NOTE — Telephone Encounter (Signed)
Patient informed and verbalized understanding of plan. 

## 2015-07-30 NOTE — Telephone Encounter (Signed)
Noted. I was told that it was at 400 mg daily. He should be at 200 mg daily, so no changes for now.

## 2015-08-04 ENCOUNTER — Telehealth: Payer: Self-pay | Admitting: *Deleted

## 2015-08-04 NOTE — Telephone Encounter (Signed)
-----   Message from Satira Sark, MD sent at 08/02/2015  7:54 PM EST ----- Reviewed. Hgb has come up to 12 which is good response. WBC and platelets are also up somewhat, so make sure he is still checking temperature and looking out for signs of infection.

## 2015-08-04 NOTE — Telephone Encounter (Signed)
-----   Message from Satira Sark, MD sent at 08/04/2015  8:12 AM EST ----- Reviewed. Trace bilateral pleural effusions, decreased on the left, no infiltrates noted. These would be expected changes following CABG.

## 2015-08-04 NOTE — Telephone Encounter (Signed)
Patient informed. 

## 2015-08-05 ENCOUNTER — Telehealth: Payer: Self-pay | Admitting: Cardiology

## 2015-08-05 ENCOUNTER — Ambulatory Visit (INDEPENDENT_AMBULATORY_CARE_PROVIDER_SITE_OTHER): Payer: Commercial Managed Care - HMO | Admitting: *Deleted

## 2015-08-05 DIAGNOSIS — I480 Paroxysmal atrial fibrillation: Secondary | ICD-10-CM

## 2015-08-05 DIAGNOSIS — Z5181 Encounter for therapeutic drug level monitoring: Secondary | ICD-10-CM

## 2015-08-05 DIAGNOSIS — Z951 Presence of aortocoronary bypass graft: Secondary | ICD-10-CM

## 2015-08-05 LAB — POCT INR: INR: 6.5

## 2015-08-05 NOTE — Telephone Encounter (Signed)
Mr. Linstrom wants to know if he can travel by plane to [place] during the dates December 9 thru the 19th.

## 2015-08-05 NOTE — Telephone Encounter (Signed)
Pt has upcoming appt 12/6 with Dr. Domenic Polite (which will be before pt leaves) and pt will discuss travel plans at that time, with understanding of importance of walking/moving on plane or car. Pt understood.

## 2015-08-10 ENCOUNTER — Other Ambulatory Visit: Payer: Self-pay | Admitting: Surgery

## 2015-08-10 DIAGNOSIS — Z951 Presence of aortocoronary bypass graft: Secondary | ICD-10-CM

## 2015-08-11 ENCOUNTER — Ambulatory Visit
Admission: RE | Admit: 2015-08-11 | Discharge: 2015-08-11 | Disposition: A | Discharge status: Home / Self Care | Payer: Commercial Managed Care - HMO | Source: Ambulatory Visit | Attending: Surgery | Admitting: Surgery

## 2015-08-11 ENCOUNTER — Ambulatory Visit (INDEPENDENT_AMBULATORY_CARE_PROVIDER_SITE_OTHER): Payer: Self-pay | Admitting: Surgery

## 2015-08-11 ENCOUNTER — Encounter: Payer: Self-pay | Admitting: Surgery

## 2015-08-11 VITALS — BP 100/65 | HR 56 | Resp 16 | Ht 67.0 in | Wt 157.0 lb

## 2015-08-11 DIAGNOSIS — Z951 Presence of aortocoronary bypass graft: Secondary | ICD-10-CM

## 2015-08-11 DIAGNOSIS — I2111 ST elevation (STEMI) myocardial infarction involving right coronary artery: Secondary | ICD-10-CM

## 2015-08-11 DIAGNOSIS — I25110 Atherosclerotic heart disease of native coronary artery with unstable angina pectoris: Secondary | ICD-10-CM

## 2015-08-11 NOTE — Progress Notes (Signed)
HPI:  Patient returns for routine postoperative follow-up having undergone CABG x 5 on 07/08/2015. The patient's early postoperative recovery while in the hospital was notable for development of recurrent postop atrial fibrillation converted with amiodarone. He had a few recurrent episodes and cardiology recommended sending him home on coumadin in addition plavix that he was on for a recent DES. Since hospital discharge the patient reports that he saw Dr. Domenic Polite on 11/4. He was still feeling fatigued at that point but since then has noted a marked improvement in his energy level. He is walking without chest pain or dyspnea. His Hgb was checked and was up to 12. His INR has been elevated on the past two checks on 11/3 and 11/10 at 6.5. His coumadin dose has been adjusted in the clinic and he should have a repeat in the next couple days. His only other complaint is a persistent cough since discharge that seems to be worse with lying down but this is improving.   Current Outpatient Prescriptions  Medication Sig Dispense Refill  . amiodarone (PACERONE) 200 MG tablet Take 1 tablet (200 mg total) by mouth daily. 90 tablet 3  . atorvastatin (LIPITOR) 80 MG tablet Take 80 mg by mouth daily at 6 PM.   1  . cholecalciferol (VITAMIN D) 1000 UNITS tablet Take 1,000 Units by mouth daily.    . clopidogrel (PLAVIX) 75 MG tablet Take 75 mg by mouth daily.   3  . lisinopril (PRINIVIL,ZESTRIL) 2.5 MG tablet Take 2.5 mg by mouth daily.   3  . Magnesium 100 MG TABS Take 1 tablet by mouth daily.    . metoprolol tartrate (LOPRESSOR) 50 MG tablet Take 1 tablet (50 mg total) by mouth 2 (two) times daily. 60 tablet 1  . Potassium 99 MG TABS Take 1 tablet by mouth daily.    . vitamin B-12 (CYANOCOBALAMIN) 100 MCG tablet Take 100 mcg by mouth daily.     Marland Kitchen warfarin (COUMADIN) 2.5 MG tablet Take 1 tablet (2.5 mg total) by mouth daily at 6 PM. As directed through the coumadin clinic 100 tablet 0  . oxyCODONE (OXY  IR/ROXICODONE) 5 MG immediate release tablet Take 1-2 tablets (5-10 mg total) by mouth every 4 (four) hours as needed for severe pain. (Patient not taking: Reported on 08/11/2015) 50 tablet 0   No current facility-administered medications for this visit.    Physical Exam: BP 100/65 mmHg  Pulse 56  Resp 16  Ht 5\' 7"  (1.702 m)  Wt 157 lb (71.215 kg)  BMI 24.58 kg/m2  SpO2 98% He looks well. Lung exam is clear. Cardiac exam shows a regular rate and rhythm with normal heart sounds. Chest incision is healing well and sternum is stable. The leg incisions are healing well and there is no peripheral edema.    Diagnostic Tests:  CLINICAL DATA: Recent coronary bypass grafting surgery, followup exam  EXAM: CHEST - 2 VIEW  COMPARISON: 07/30/2015  FINDINGS: Cardiac shadow is within normal limits. Postsurgical changes are again seen. The lungs are well aerated bilaterally. No focal infiltrate or sizable effusion is noted.  IMPRESSION: No acute abnormality noted.   Electronically Signed  By: Inez Catalina M.D.  On: 08/11/2015 08:56        Impression:  Overall I think he is doing well. I encouraged him to continue walking. He is planning to participate in cardiac rehab. I told him he could drive his car but should not lift anything heavier than 10  lbs for three months postop. He appears to be maintaining sinus rhythm so I will stop his amiodarone today. This should also help with his supra-therapeutic INR. If he is maintaining sinus rhythm off amiodarone he should be able to get off coumadin at his next visit with Dr. Domenic Polite in a few weeks. I am not sure if his cough is related to the lisinopril but it seems to be getting better and could just be due to postop atelectasis. I would observe this for now before discontinuing the lisinopril.   Plan:  He will stop the amiodarone now. He will follow up with Dr. Domenic Polite in a few weeks.   Gaye Pollack, MD [facility] [phone]

## 2015-08-12 ENCOUNTER — Ambulatory Visit (INDEPENDENT_AMBULATORY_CARE_PROVIDER_SITE_OTHER): Payer: Commercial Managed Care - HMO | Admitting: *Deleted

## 2015-08-12 DIAGNOSIS — I4891 Unspecified atrial fibrillation: Secondary | ICD-10-CM

## 2015-08-12 DIAGNOSIS — Z951 Presence of aortocoronary bypass graft: Secondary | ICD-10-CM | POA: Diagnosis not present

## 2015-08-12 DIAGNOSIS — Z5181 Encounter for therapeutic drug level monitoring: Secondary | ICD-10-CM

## 2015-08-12 DIAGNOSIS — I480 Paroxysmal atrial fibrillation: Secondary | ICD-10-CM

## 2015-08-12 LAB — POCT INR: INR: 2.8

## 2015-08-13 ENCOUNTER — Other Ambulatory Visit: Payer: Self-pay | Admitting: Surgical

## 2015-08-17 ENCOUNTER — Encounter (HOSPITAL_COMMUNITY): Payer: Commercial Managed Care - HMO

## 2015-08-17 ENCOUNTER — Ambulatory Visit (INDEPENDENT_AMBULATORY_CARE_PROVIDER_SITE_OTHER): Payer: Commercial Managed Care - HMO | Admitting: Cardiology

## 2015-08-17 ENCOUNTER — Encounter: Payer: Self-pay | Admitting: Cardiology

## 2015-08-17 VITALS — BP 109/68 | HR 58 | Ht 67.0 in | Wt 162.2 lb

## 2015-08-17 DIAGNOSIS — Z5181 Encounter for therapeutic drug level monitoring: Secondary | ICD-10-CM

## 2015-08-17 DIAGNOSIS — I9789 Other postprocedural complications and disorders of the circulatory system, not elsewhere classified: Secondary | ICD-10-CM

## 2015-08-17 DIAGNOSIS — I2510 Atherosclerotic heart disease of native coronary artery without angina pectoris: Secondary | ICD-10-CM

## 2015-08-17 DIAGNOSIS — I480 Paroxysmal atrial fibrillation: Secondary | ICD-10-CM

## 2015-08-17 DIAGNOSIS — I2111 ST elevation (STEMI) myocardial infarction involving right coronary artery: Secondary | ICD-10-CM

## 2015-08-17 DIAGNOSIS — I4891 Unspecified atrial fibrillation: Secondary | ICD-10-CM

## 2015-08-17 DIAGNOSIS — E782 Mixed hyperlipidemia: Secondary | ICD-10-CM | POA: Diagnosis not present

## 2015-08-17 MED ORDER — METOPROLOL TARTRATE 25 MG PO TABS: 25.0000 mg | ORAL_TABLET | Freq: Two times a day (BID) | ORAL | Status: DC

## 2015-08-17 MED ORDER — LOSARTAN POTASSIUM 25 MG PO TABS: 12.5000 mg | ORAL_TABLET | Freq: Every day | ORAL | Status: DC

## 2015-08-17 NOTE — Progress Notes (Signed)
Cardiology Office Note  Date: 08/17/2015   ID: Geoffrey [name], DOB 11-Feb-1941, MRN [id number]  PCP: Curlene Labrum, MD  Primary Cardiologist: Rozann Lesches, MD   Chief Complaint  Patient presents with  . Coronary Artery Disease    History of Present Illness: Geoffrey [name] is a 74 y.o. male last seen in early November following CABG with Dr. Cyndia Bent as detailed in the prior note. He had a recent follow-up visit with Dr. Cyndia Bent on November 16 and was felt to be recuperating well. He was cleared to drive but not lift anything heavier than 10 pounds for 3 months. He was also taken off of amiodarone with anticipated discontinuation of Coumadin at our next visit.  He comes in today stating that he continues to feel better, improving energy. He has intermittent dry cough, we discussed stopping his lisinopril and replacing it with a low dose ARB to see if this helps. We also reviewed his medications with plan to reduce his Lopressor dose, recheck lipids and LFTs to see how he is doing on Lipitor, and also stop his Coumadin since he maintaining sinus rhythm.  Follow-up ECG today shows sinus rhythm with possible old inferior infarct pattern.  He plans to fly out to [place] to visit his daughter in early December. I reminded him to take his medications with him.   Past Medical History  Diagnosis Date  . CAD (coronary artery disease)     DES to proximal and mid RCA May 23, 2015 - [place]; subsequent CABG 05/2015 Dr. Cyndia Bent  . Hyperlipidemia   . Essential hypertension   . Inferior STEMI August 2016  . History of pneumonia   . Arthritis   . 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 Postoperative atrial fibrillation [facility])     Past Surgical History  Procedure Laterality Date  . Hernia repair    . Cardiac catheterization    . Coronary angioplasty  May 23, 2015    X 2 stents  . Colonoscopy    . Coronary artery bypass graft N/A 07/08/2015    Procedure: CORONARY ARTERY  BYPASS GRAFTING (CABG), ON PUMP, TIMES FIVE, USING LEFT INTERNAL MAMMARY ARTERY, RIGHT GREATER SAPHENOUS VEIN HARVESTED ENDOSCOPICALLY;  Surgeon: Gaye Pollack, MD;  Location: [place];  Service: Open Heart Surgery;  Laterality: N/A;  . Tee without cardioversion N/A 07/08/2015    Procedure: TRANSESOPHAGEAL ECHOCARDIOGRAM (TEE);  Surgeon: Gaye Pollack, MD;  Location: [place];  Service: Open Heart Surgery;  Laterality: N/A;    Current Outpatient Prescriptions  Medication Sig Dispense Refill  . atorvastatin (LIPITOR) 80 MG tablet Take 80 mg by mouth daily at 6 PM.   1  . clopidogrel (PLAVIX) 75 MG tablet Take 75 mg by mouth daily.   3  . lisinopril (PRINIVIL,ZESTRIL) 2.5 MG tablet Take 2.5 mg by mouth daily.   3  . metoprolol tartrate (LOPRESSOR) 50 MG tablet Take 1 tablet (50 mg total) by mouth 2 (two) times daily. 60 tablet 1  . warfarin (COUMADIN) 2.5 MG tablet Take 1 tablet (2.5 mg total) by mouth daily at 6 PM. As directed through the coumadin clinic 100 tablet 0  . cholecalciferol (VITAMIN D) 1000 UNITS tablet Take 1,000 Units by mouth daily.    . Magnesium 100 MG TABS Take 1 tablet by mouth daily.    . Potassium 99 MG TABS Take 1 tablet by mouth daily.    . vitamin B-12 (CYANOCOBALAMIN) 100 MCG tablet  Take 100 mcg by mouth daily.      No current facility-administered medications for this visit.    Allergies:  Review of patient's allergies indicates no known allergies.   Social History: The patient  reports that he has quit smoking. His smoking use included Cigarettes. He started smoking about 40 years ago. He does not have any smokeless tobacco history on file. He reports that he does not drink alcohol or use illicit drugs.   ROS:  Please see the history of present illness. Otherwise, complete review of systems is positive for improving appetite and energy.  All other systems are reviewed and negative.   Physical Exam: VS:  BP 109/68 mmHg  Pulse 58  Ht 5\' 7"  (1.702 m)  Wt 162 lb 3.2  oz (73.573 kg)  BMI 25.40 kg/m2  SpO2 98%, BMI Body mass index is 25.4 kg/(m^2).  Wt Readings from Last 3 Encounters:  08/17/15 162 lb 3.2 oz (73.573 kg)  08/11/15 157 lb (71.215 kg)  07/30/15 157 lb 12.8 oz (71.578 kg)     General: Patient appears comfortable at rest. HEENT: Conjunctiva and lids normal, oropharynx clear. Neck: Supple, no elevated JVP or carotid bruits, no thyromegaly. Thorax: Well-healing sternal incision. No erythema or drainage. Lungs: Clear without wheezing or rhonchi, nonlabored breathing at rest. Cardiac: Regular rate and rhythm, no S3, no pericardial rub. Abdomen: Soft, nontender, bowel sounds present, no guarding or rebound. Extremities: No pitting edema, vein harvest site stable on the right, distal pulses 2+. Skin: Warm and dry. Musculoskeletal: No kyphosis. Neuropsychiatric: Alert and oriented x3, affect grossly appropriate.   ECG: . Tracing from 07/30/2015 showed sinus rhythm with low voltage and nonspecific ST-T changes.   Recent Labwork: 07/06/2015: ALT 25; AST 26 07/09/2015: Magnesium 2.5* 07/12/2015: BUN 7; Creatinine, Ser 0.48*; Hemoglobin 8.9*; Platelets 183; Potassium 4.0; Sodium 136  November 2016: Hemoglobin 12.0, WBC 12.0, platelets 558  Other Studies Reviewed Today:  Chest x-ray 08/11/2015: FINDINGS: Cardiac shadow is within normal limits. Postsurgical changes are again seen. The lungs are well aerated bilaterally. No focal infiltrate or sizable effusion is noted.  IMPRESSION: No acute abnormality noted.  Echocardiogram 06/24/2015: Study Conclusions  - Left ventricle: The cavity size was normal. Wall thickness was normal. Systolic function was normal. The estimated ejection fraction was in the range of 55% to 60%. There is hypokinesis of the basalinferior myocardium. Doppler parameters are consistent with abnormal left ventricular relaxation (grade 1 diastolic dysfunction). - Aortic valve: Mildly calcified annulus.  Trileaflet; mildly calcified leaflets. Cusp separation was mildly reduced. There was no significant regurgitation. Peak gradient (S): 9 mm Hg. Peak velocity ratio of LVOT to aortic valve: 0.84. Valve area (Vmax): 3.18 cm^2. - Mitral valve: There was trivial regurgitation. - Right atrium: Central venous pressure (est): 3 mm Hg. - Atrial septum: No defect or patent foramen ovale was identified. - Tricuspid valve: There was trivial regurgitation. - Pulmonary arteries: Systolic pressure could not be accurately estimated. - Pericardium, extracardiac: There was no pericardial effusion.  Impressions:  - Normal LV wall thickness with LVEF 55-60%, basal inferior hypokinesis, and grade 1 diastolic dysfunction. Sclerotic aortic valve without stenosis. Trivial mitral and tricuspid regurgitation.  Assessment and Plan:  1. Multivessel CAD status post CABG in October. Patient is improving steadily at this point. He has had a dryer mitten cough, recent chest x-ray clear. We will stop lisinopril and replace it with Cozaar beginning at 12.5 mg daily. Also plan to reduce Lopressor to 25 mg twice daily with low  resting heart rate to see if this further improves his energy.  2. History of inferior STEMI with DES to the proximal and mid RCA in August 2016 in [place]. He continues on aspirin and Plavix.  3. Hyperlipidemia, on high-dose Lipitor. Follow-up FLP and LFTs.  4. Postoperative atrial fibrillation, resolved at present. He has been taken off amiodarone by Dr. Cyndia Bent, and we will stop Coumadin today.  Current medicines were reviewed with the patient today.   Orders Placed This Encounter  Procedures  . EKG 12-Lead    Disposition: FU with me in 3 months.   Signed, Satira Sark, MD, [facility] 08/17/2015 2:24 PM    [facility] [place] [facility], [place], [place] [address] Phone: [phone]; Fax: [phone]

## 2015-08-17 NOTE — Patient Instructions (Addendum)
Your physician has recommended you make the following change in your medication:  Stop warfarin. Stop lisinopril. Start losartan or cozaar 12.5 mg daily. Decrease your lopressor to 25 mg twice daily. Please break your 50 mg tablet in half twice daily until they are finished. Continue all other medications the same. Your physician recommends that you return for a FASTING lipid/liver profile as soon as possible. Your physician recommends that you schedule a follow-up appointment in: 3 months.

## 2015-08-24 ENCOUNTER — Ambulatory Visit: Payer: Self-pay | Admitting: *Deleted

## 2015-08-24 DIAGNOSIS — I4891 Unspecified atrial fibrillation: Secondary | ICD-10-CM

## 2015-08-24 DIAGNOSIS — Z951 Presence of aortocoronary bypass graft: Secondary | ICD-10-CM

## 2015-08-26 ENCOUNTER — Encounter (HOSPITAL_COMMUNITY): Payer: Commercial Managed Care - HMO

## 2015-08-31 ENCOUNTER — Ambulatory Visit: Payer: Commercial Managed Care - HMO | Admitting: Cardiology

## 2015-09-08 ENCOUNTER — Ambulatory Visit: Payer: Commercial Managed Care - HMO | Admitting: Cardiology

## 2015-09-09 ENCOUNTER — Ambulatory Visit (HOSPITAL_COMMUNITY)
Admission: RE | Admit: 2015-09-09 | Discharge: 2015-09-09 | Disposition: A | Discharge status: Home / Self Care | Payer: Commercial Managed Care - HMO | Source: Ambulatory Visit | Attending: Cardiology | Admitting: Cardiology

## 2015-09-09 VITALS — BP 102/56 | HR 58 | Ht 66.0 in | Wt 163.6 lb

## 2015-09-09 DIAGNOSIS — Z955 Presence of coronary angioplasty implant and graft: Secondary | ICD-10-CM | POA: Diagnosis not present

## 2015-09-09 DIAGNOSIS — I213 ST elevation (STEMI) myocardial infarction of unspecified site: Secondary | ICD-10-CM | POA: Insufficient documentation

## 2015-09-09 DIAGNOSIS — I2111 ST elevation (STEMI) myocardial infarction involving right coronary artery: Secondary | ICD-10-CM

## 2015-09-09 DIAGNOSIS — Z951 Presence of aortocoronary bypass graft: Secondary | ICD-10-CM | POA: Insufficient documentation

## 2015-09-09 NOTE — Progress Notes (Signed)
Cardiac/Pulmonary Rehab Medication Review by a Pharmacist  Does the patient  feel that his/her medications are working for him/her?  yes  Has the patient been experiencing any side effects to the medications prescribed?  no  Does the patient measure his/her own blood pressure or blood glucose at home?  yes   Does the patient have any problems obtaining medications due to transportation or finances?   no  Understanding of regimen: good Understanding of indications: good Potential of compliance: excellent  Questions asked to Determine Patient Understanding of Medication Regimen:  1. What is the name of the medication?  2. What is the medication used for?  3. When should it be taken?  4. How much should be taken?  5. How will you take it?  6. What side effects should you report?  Understanding Defined as: Excellent: All questions above are correct Good: Questions 1-4 are correct Fair: Questions 1-2 are correct  Poor: 1 or none of the above questions are correct   Pharmacist comments: Patient is compliant with medications. Recently changed from ACE-I to ARB due to cough. Encouraged patient to take blood pressure daily. Tolerating and understands current medications.  Thanks, Isac Sarna, BS Pharm D, [place] Clinical Pharmacist Pager [phone] 09/09/2015 8:50 AM

## 2015-09-09 NOTE — Patient Instructions (Signed)
Pt has finished orientation/education  and is scheduled to start CR on 09/13/15 at 9:30.  Pt has been instructed to arrive to class 15 minutes early for scheduled class. Pt has been instructed to wear comfortable clothing and shoes with rubber soles. Pt has been told to take their medications 1 hour prior to coming to class.  If the patient is not going to attend class, he/she has been instructed to call.

## 2015-09-09 NOTE — Progress Notes (Signed)
Patient arrived for 1st visit/orientation/education at 0800. Patient was referred to CR by Dr. 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 During orientation advised patient on arrival and appointment times what to wear, what to do before, during and after exercise. Reviewed attendance and class policy. Talked about inclement weather and class consultation policy. Pt is scheduled to return Cardiac Rehab on 09/13/15 at 0930.  Pt was advised to come to class 15 minutes before class starts. He was also given instructions on meeting with the dietician and attending the Family Structure classes. Pt is eager to get started. Patient was able to complete 6 minute walk test. Patient was measured for the equipment. Discussed equipment safety with patient. Took patient pre-anthropometric measurements. Patient scored 0 on PHQ-2 and scored 8 on PHQ-9. Patatient stated that these scores are reflected by his recent heart surgery and that he is not depressed and does not need counseling. He feels that he will feel better once he starts to exercise. Patient finished visit at 11:25.

## 2015-09-13 ENCOUNTER — Encounter (HOSPITAL_COMMUNITY)
Admission: RE | Admit: 2015-09-13 | Discharge: 2015-09-13 | Disposition: A | Discharge status: Home / Self Care | Payer: Commercial Managed Care - HMO | Source: Ambulatory Visit | Attending: Cardiology | Admitting: Cardiology

## 2015-09-13 DIAGNOSIS — Z955 Presence of coronary angioplasty implant and graft: Secondary | ICD-10-CM | POA: Insufficient documentation

## 2015-09-13 DIAGNOSIS — I213 ST elevation (STEMI) myocardial infarction of unspecified site: Secondary | ICD-10-CM | POA: Diagnosis not present

## 2015-09-15 ENCOUNTER — Encounter (HOSPITAL_COMMUNITY)
Admission: RE | Admit: 2015-09-15 | Discharge: 2015-09-15 | Disposition: A | Discharge status: Home / Self Care | Payer: Commercial Managed Care - HMO | Source: Ambulatory Visit | Attending: Cardiology | Admitting: Cardiology

## 2015-09-15 DIAGNOSIS — I213 ST elevation (STEMI) myocardial infarction of unspecified site: Secondary | ICD-10-CM | POA: Diagnosis not present

## 2015-09-15 NOTE — Progress Notes (Signed)
Cardiac Rehabilitation Program Outcomes Report   Orientation:  09/09/15 Graduate Date:  tbd Discharge Date:  tbd # of sessions completed: 3  Cardiologist: Domenic Polite Family MD:  burdine Class Time:  0930  A.  Exercise Program:  Tolerates exercise @ 2.68 METS for 15 minutes and Walk Test Results:  Pre: 2.68 mets  B.  Mental Health:  Good mental attitude and PHQ-9: 8.  patient stated is was because of recent events and is not depressed and feels he does not need counseling.   C.  Education/Instruction/Skills  Accurately checks own pulse.  Rest:  59  Exercise:  75  Uses Perceived Exertion Scale and/or Dyspnea Scale  D.  Nutrition/Weight Control/Body Composition:  Adherence to prescribed nutrition program: fair    E.  Blood Lipids   No results found for: CHOL, HDL, LDLCALC, LDLDIRECT, TRIG, CHOLHDL  F.  Lifestyle Changes:  Making positive lifestyle changes and Not smoking:  Quit 1976  G.  Symptoms noted with exercise:  Asymptomatic  Report Completed By:    Stevphen Rochester RN   Comments:  This is the patients first week progress note for [facility].

## 2015-09-17 ENCOUNTER — Encounter (HOSPITAL_COMMUNITY)
Admission: RE | Admit: 2015-09-17 | Discharge: 2015-09-17 | Disposition: A | Discharge status: Home / Self Care | Payer: Commercial Managed Care - HMO | Source: Ambulatory Visit | Attending: Cardiology | Admitting: Cardiology

## 2015-09-17 DIAGNOSIS — I213 ST elevation (STEMI) myocardial infarction of unspecified site: Secondary | ICD-10-CM | POA: Diagnosis not present

## 2015-09-20 ENCOUNTER — Encounter (HOSPITAL_COMMUNITY): Payer: Commercial Managed Care - HMO

## 2015-09-22 ENCOUNTER — Encounter (HOSPITAL_COMMUNITY): Payer: Commercial Managed Care - HMO

## 2015-09-22 ENCOUNTER — Telehealth: Payer: Self-pay | Admitting: *Deleted

## 2015-09-22 NOTE — Telephone Encounter (Signed)
-----   Message from Satira Sark, MD sent at 09/21/2015  8:38 PM EST ----- Reviewed. LDL 75 looks good on current treatment.

## 2015-09-22 NOTE — Telephone Encounter (Signed)
Pt aware.

## 2015-09-24 ENCOUNTER — Encounter (HOSPITAL_COMMUNITY): Payer: Commercial Managed Care - HMO

## 2015-09-27 ENCOUNTER — Encounter (HOSPITAL_COMMUNITY): Payer: Commercial Managed Care - HMO

## 2015-09-29 ENCOUNTER — Encounter (HOSPITAL_COMMUNITY): Payer: Commercial Managed Care - HMO

## 2015-10-01 ENCOUNTER — Encounter (HOSPITAL_COMMUNITY): Payer: Commercial Managed Care - HMO

## 2015-10-01 NOTE — Progress Notes (Signed)
Patient quit [facility] Cardiac Rehab at this time due to finances.

## 2015-10-04 ENCOUNTER — Encounter (HOSPITAL_COMMUNITY): Payer: Commercial Managed Care - HMO

## 2015-10-06 ENCOUNTER — Encounter (HOSPITAL_COMMUNITY): Payer: Commercial Managed Care - HMO

## 2015-10-08 ENCOUNTER — Encounter (HOSPITAL_COMMUNITY): Payer: Commercial Managed Care - HMO

## 2015-10-11 ENCOUNTER — Encounter (HOSPITAL_COMMUNITY): Payer: Commercial Managed Care - HMO

## 2015-10-13 ENCOUNTER — Encounter (HOSPITAL_COMMUNITY): Payer: Commercial Managed Care - HMO

## 2015-10-15 ENCOUNTER — Encounter (HOSPITAL_COMMUNITY): Payer: Commercial Managed Care - HMO

## 2015-10-18 ENCOUNTER — Encounter (HOSPITAL_COMMUNITY): Payer: Commercial Managed Care - HMO

## 2015-10-20 ENCOUNTER — Encounter: Payer: Self-pay | Admitting: Cardiology

## 2015-10-20 ENCOUNTER — Encounter (HOSPITAL_COMMUNITY): Payer: Commercial Managed Care - HMO

## 2015-10-22 ENCOUNTER — Encounter (HOSPITAL_COMMUNITY): Payer: Commercial Managed Care - HMO

## 2015-10-25 ENCOUNTER — Encounter (HOSPITAL_COMMUNITY): Payer: Commercial Managed Care - HMO

## 2015-10-27 ENCOUNTER — Encounter (HOSPITAL_COMMUNITY): Payer: Commercial Managed Care - HMO

## 2015-10-29 ENCOUNTER — Encounter (HOSPITAL_COMMUNITY): Payer: Commercial Managed Care - HMO

## 2015-11-01 ENCOUNTER — Encounter (HOSPITAL_COMMUNITY): Payer: Commercial Managed Care - HMO

## 2015-11-03 ENCOUNTER — Encounter (HOSPITAL_COMMUNITY): Payer: Commercial Managed Care - HMO

## 2015-11-05 ENCOUNTER — Encounter (HOSPITAL_COMMUNITY): Payer: Commercial Managed Care - HMO

## 2015-11-08 ENCOUNTER — Encounter (HOSPITAL_COMMUNITY): Payer: Commercial Managed Care - HMO

## 2015-11-10 ENCOUNTER — Encounter (HOSPITAL_COMMUNITY): Payer: Commercial Managed Care - HMO

## 2015-11-12 ENCOUNTER — Encounter (HOSPITAL_COMMUNITY): Payer: Commercial Managed Care - HMO

## 2015-11-15 ENCOUNTER — Encounter (HOSPITAL_COMMUNITY): Payer: Commercial Managed Care - HMO

## 2015-11-17 ENCOUNTER — Encounter (HOSPITAL_COMMUNITY): Payer: Commercial Managed Care - HMO

## 2015-11-18 ENCOUNTER — Ambulatory Visit: Payer: Commercial Managed Care - HMO | Admitting: Cardiology

## 2015-11-18 ENCOUNTER — Encounter: Payer: Self-pay | Admitting: *Deleted

## 2015-11-18 ENCOUNTER — Encounter: Payer: Self-pay | Admitting: Cardiology

## 2015-11-18 ENCOUNTER — Ambulatory Visit (INDEPENDENT_AMBULATORY_CARE_PROVIDER_SITE_OTHER): Payer: Commercial Managed Care - HMO | Admitting: Cardiology

## 2015-11-18 VITALS — BP 116/70 | HR 69 | Ht 67.0 in | Wt 165.6 lb

## 2015-11-18 DIAGNOSIS — Z8679 Personal history of other diseases of the circulatory system: Secondary | ICD-10-CM

## 2015-11-18 DIAGNOSIS — E782 Mixed hyperlipidemia: Secondary | ICD-10-CM | POA: Diagnosis not present

## 2015-11-18 DIAGNOSIS — I2510 Atherosclerotic heart disease of native coronary artery without angina pectoris: Secondary | ICD-10-CM

## 2015-11-18 NOTE — Patient Instructions (Signed)
Continue all current medications. Your physician wants you to follow up in: 6 months.  You will receive a reminder letter in the mail one-two months in advance.  If you don't receive a letter, please call our office to schedule the follow up appointment   

## 2015-11-18 NOTE — Progress Notes (Signed)
Cardiology Office Note  Date: 11/18/2015   ID: Geoffrey [name], DOB Jul 12, 1941, MRN [id number]  PCP: Curlene Labrum, MD  Primary Cardiologist: Rozann Lesches, MD   Chief Complaint  Patient presents with  . Coronary Artery Disease    History of Present Illness: Geoffrey [name] is a 75 y.o. male last seen in November 2016. He presents for a routine follow-up visit. Overall feeling well, no angina symptoms. He reports compliance with his medications. Does mention financial concerns and constraints related to medical bills in light of his cardiac history over the last year. He is trying to cut back on unnecessary expenses.  He started in cardiac rehabilitation but ultimately quit the program related to finances. He is exercising at the [facility] which he qualifies for for free. He is exercising 5 days a week.  We went over his medications which are outlined below. He is currently on Plavix, Cozaar, Lipitor, and Lopressor.  Past Medical History  Diagnosis Date  . CAD (coronary artery disease)     DES to proximal and mid RCA May 23, 2015 - [place]; subsequent CABG 05/2015 Dr. Cyndia Bent  . Hyperlipidemia   . Essential hypertension   . Inferior STEMI August 2016  . History of pneumonia   . Arthritis   . 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 Postoperative atrial fibrillation [facility])     Past Surgical History  Procedure Laterality Date  . Hernia repair    . Cardiac catheterization    . Coronary angioplasty  May 23, 2015    X 2 stents  . Colonoscopy    . Coronary artery bypass graft N/A 07/08/2015    Procedure: CORONARY ARTERY BYPASS GRAFTING (CABG), ON PUMP, TIMES FIVE, USING LEFT INTERNAL MAMMARY ARTERY, RIGHT GREATER SAPHENOUS VEIN HARVESTED ENDOSCOPICALLY;  Surgeon: Gaye Pollack, MD;  Location: [place];  Service: Open Heart Surgery;  Laterality: N/A;  . Tee without cardioversion N/A 07/08/2015    Procedure: TRANSESOPHAGEAL ECHOCARDIOGRAM (TEE);  Surgeon: Gaye Pollack,  MD;  Location: [place];  Service: Open Heart Surgery;  Laterality: N/A;    Current Outpatient Prescriptions  Medication Sig Dispense Refill  . atorvastatin (LIPITOR) 80 MG tablet Take 80 mg by mouth daily at 6 PM.   1  . clopidogrel (PLAVIX) 75 MG tablet Take 75 mg by mouth daily.   3  . losartan (COZAAR) 25 MG tablet Take 0.5 tablets (12.5 mg total) by mouth daily. 45 tablet 3  . metoprolol tartrate (LOPRESSOR) 25 MG tablet Take 25 mg by mouth 2 (two) times daily.    . Multiple Vitamin (MULTIVITAMIN) tablet Take 1 tablet by mouth daily.     No current facility-administered medications for this visit.   Allergies:  Lisinopril   Social History: The patient  reports that he has quit smoking. His smoking use included Cigarettes. He started smoking about 41 years ago. He does not have any smokeless tobacco history on file. He reports that he does not drink alcohol or use illicit drugs.   Family History: The patient's family history includes Cancer in his father; Heart disease in his father; Hyperlipidemia in his father and mother; Hypertension in his father; Kidney cancer in his father; Leukemia in his father.   ROS:  Please see the history of present illness. Otherwise, complete review of systems is positive for none.  All other systems are reviewed and negative.   Physical Exam: VS:  BP 116/70 mmHg  Pulse 69  Ht 5\' 7"  (1.702 m)  Wt 165 lb 9.6 oz (75.116 kg)  BMI 25.93 kg/m2  SpO2 98%, BMI Body mass index is 25.93 kg/(m^2).  Wt Readings from Last 3 Encounters:  11/18/15 165 lb 9.6 oz (75.116 kg)  09/09/15 163 lb 9.6 oz (74.208 kg)  08/17/15 162 lb 3.2 oz (73.573 kg)    General: Patient appears comfortable at rest. HEENT: Conjunctiva and lids normal, oropharynx clear. Neck: Supple, no elevated JVP or carotid bruits, no thyromegaly. Thorax: Well-healing sternal incision. No erythema or drainage. Lungs: Clear without wheezing or rhonchi, nonlabored breathing at rest. Cardiac: Regular  rate and rhythm, no S3, no pericardial rub. Abdomen: Soft, nontender, bowel sounds present, no guarding or rebound. Extremities: No pitting edema, vein harvest site stable on the right, distal pulses 2+.  ECG: I personally reviewed the prior tracing from 08/17/2015 which normal sinus rhythm possible old inferior infarct pattern.  Recent Labwork: 07/06/2015: ALT 25; AST 26 07/09/2015: Magnesium 2.5* 07/12/2015: BUN 7; Creatinine, Ser 0.48*; Hemoglobin 8.9*; Platelets 183; Potassium 4.0; Sodium 136  December 2016: BUN 14, creatinine 0.8, potassium 4.8, AST 21, ALT 23, cholesterol 168, triglycerides 208, HDL 51, LDL 75  Other Studies Reviewed Today:  Echocardiogram 06/24/2015: Study Conclusions  - Left ventricle: The cavity size was normal. Wall thickness was normal. Systolic function was normal. The estimated ejection fraction was in the range of 55% to 60%. There is hypokinesis of the basalinferior myocardium. Doppler parameters are consistent with abnormal left ventricular relaxation (grade 1 diastolic dysfunction). - Aortic valve: Mildly calcified annulus. Trileaflet; mildly calcified leaflets. Cusp separation was mildly reduced. There was no significant regurgitation. Peak gradient (S): 9 mm Hg. Peak velocity ratio of LVOT to aortic valve: 0.84. Valve area (Vmax): 3.18 cm^2. - Mitral valve: There was trivial regurgitation. - Right atrium: Central venous pressure (est): 3 mm Hg. - Atrial septum: No defect or patent foramen ovale was identified. - Tricuspid valve: There was trivial regurgitation. - Pulmonary arteries: Systolic pressure could not be accurately estimated. - Pericardium, extracardiac: There was no pericardial effusion.  Impressions:  - Normal LV wall thickness with LVEF 55-60%, basal inferior hypokinesis, and grade 1 diastolic dysfunction. Sclerotic aortic valve without stenosis. Trivial mitral and tricuspid regurgitation.  Assessment  and Plan:  1. Symptomatically stable CAD status post CABG in September 2016. Plan is to continue current medical regimen and observation. I encouraged him to continue with regular activity, he has a good exercise plan at this time.  2. Hyperlipidemia, LDL down to 75 on Lipitor. No changes made today.  3. History of hypertension, blood pressure is normal today.  Current medicines were reviewed with the patient today.  Disposition: FU with me in 6 months.   Signed, Satira Sark, MD, [facility] 11/18/2015 4:25 PM    [place] at [place], [place],  [address] Phone: [phone]; Fax: [phone]

## 2015-11-19 ENCOUNTER — Encounter (HOSPITAL_COMMUNITY): Payer: Commercial Managed Care - HMO

## 2015-11-22 ENCOUNTER — Encounter (HOSPITAL_COMMUNITY): Payer: Commercial Managed Care - HMO

## 2015-11-24 ENCOUNTER — Encounter (HOSPITAL_COMMUNITY): Payer: Commercial Managed Care - HMO

## 2015-11-26 ENCOUNTER — Encounter (HOSPITAL_COMMUNITY): Payer: Commercial Managed Care - HMO

## 2015-11-29 ENCOUNTER — Encounter (HOSPITAL_COMMUNITY): Payer: Commercial Managed Care - HMO

## 2015-11-29 ENCOUNTER — Other Ambulatory Visit: Payer: Self-pay | Admitting: Cardiology

## 2015-12-01 ENCOUNTER — Encounter (HOSPITAL_COMMUNITY): Payer: Commercial Managed Care - HMO

## 2015-12-03 ENCOUNTER — Encounter (HOSPITAL_COMMUNITY): Payer: Commercial Managed Care - HMO

## 2016-01-11 IMAGING — CR DG CHEST 2V
2 series · 2 of 2 positions shown · non-contrast
Comparison: None.

CLINICAL DATA: Shortness of breath, preop CABG

EXAM:
CHEST  2 VIEW

[w chest pa]
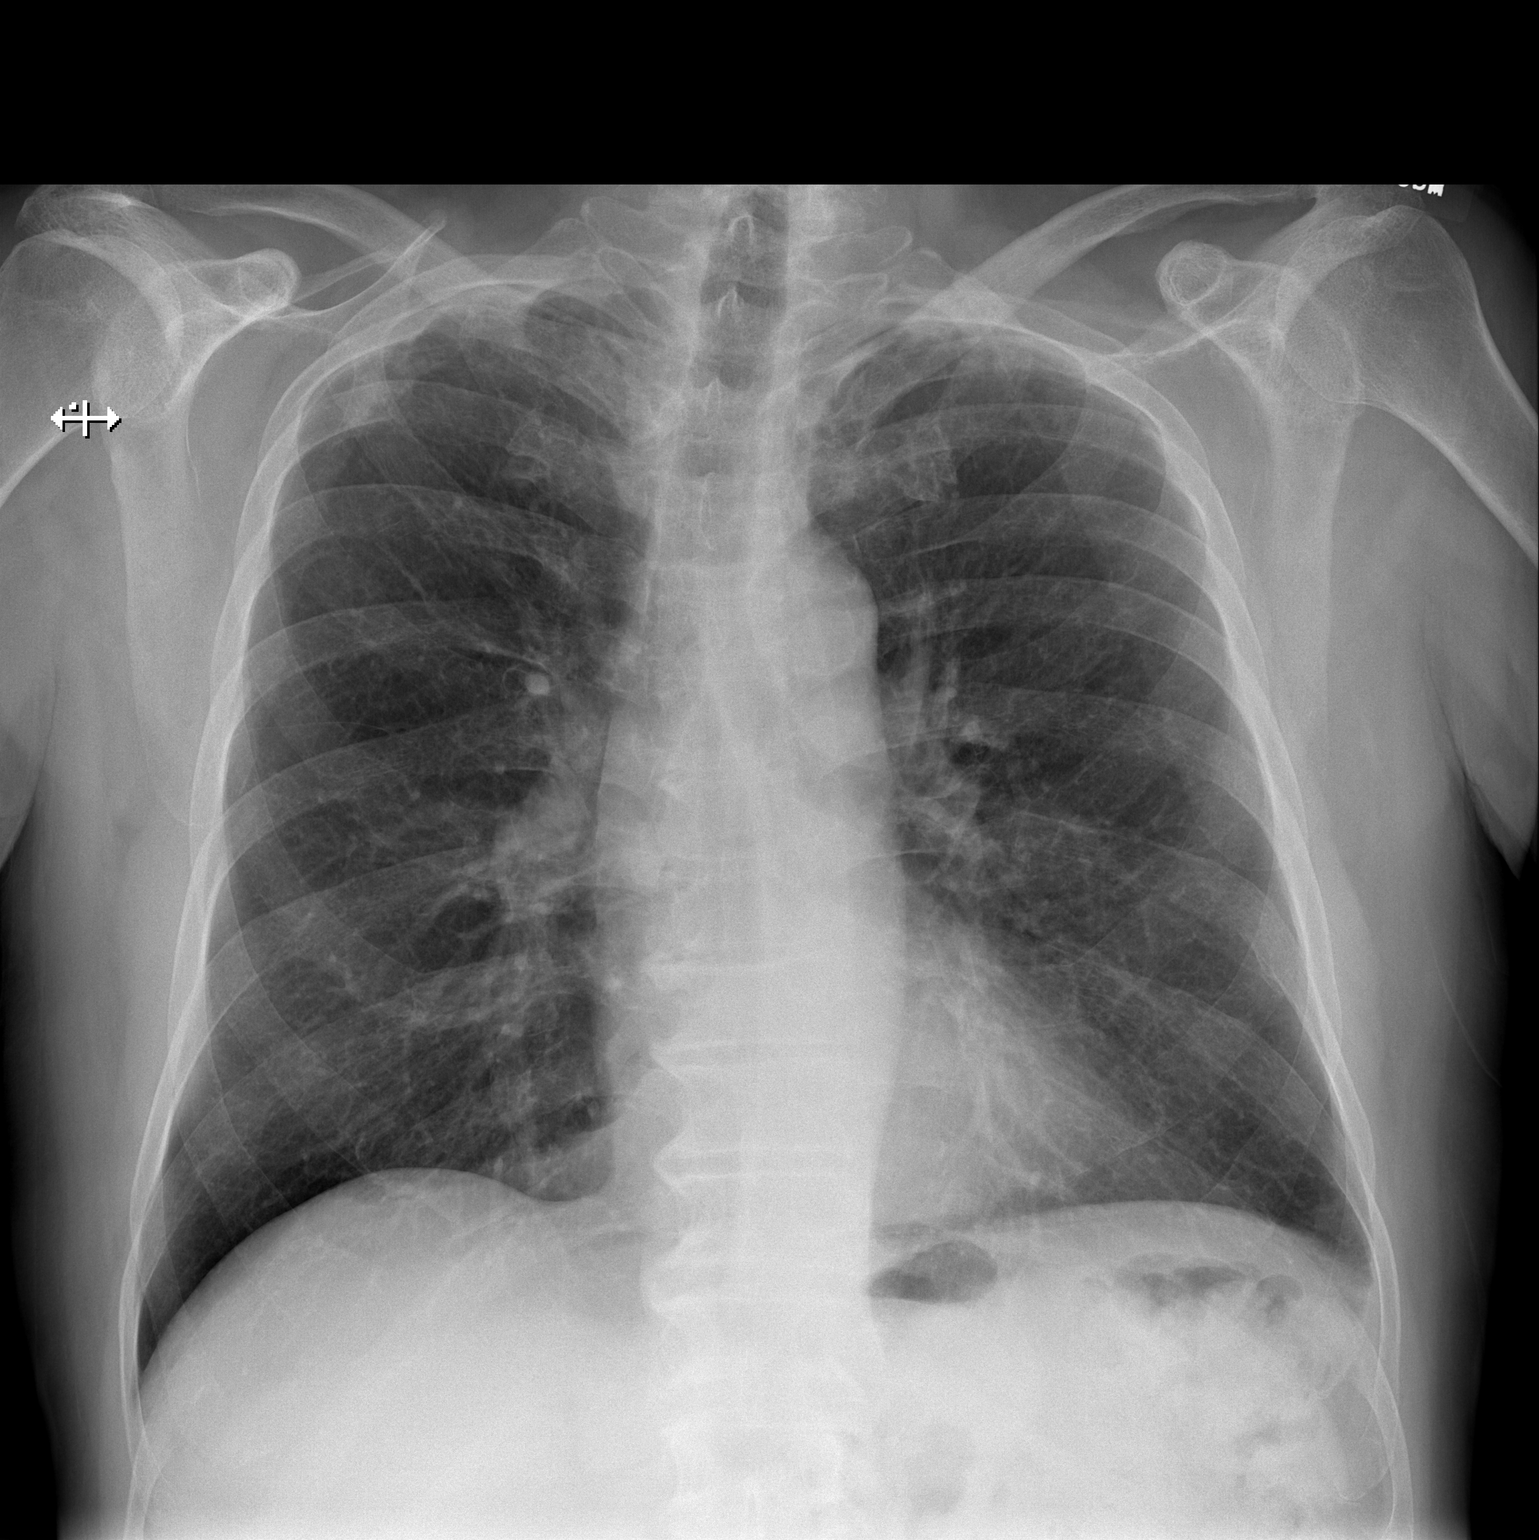

[w chest lat]
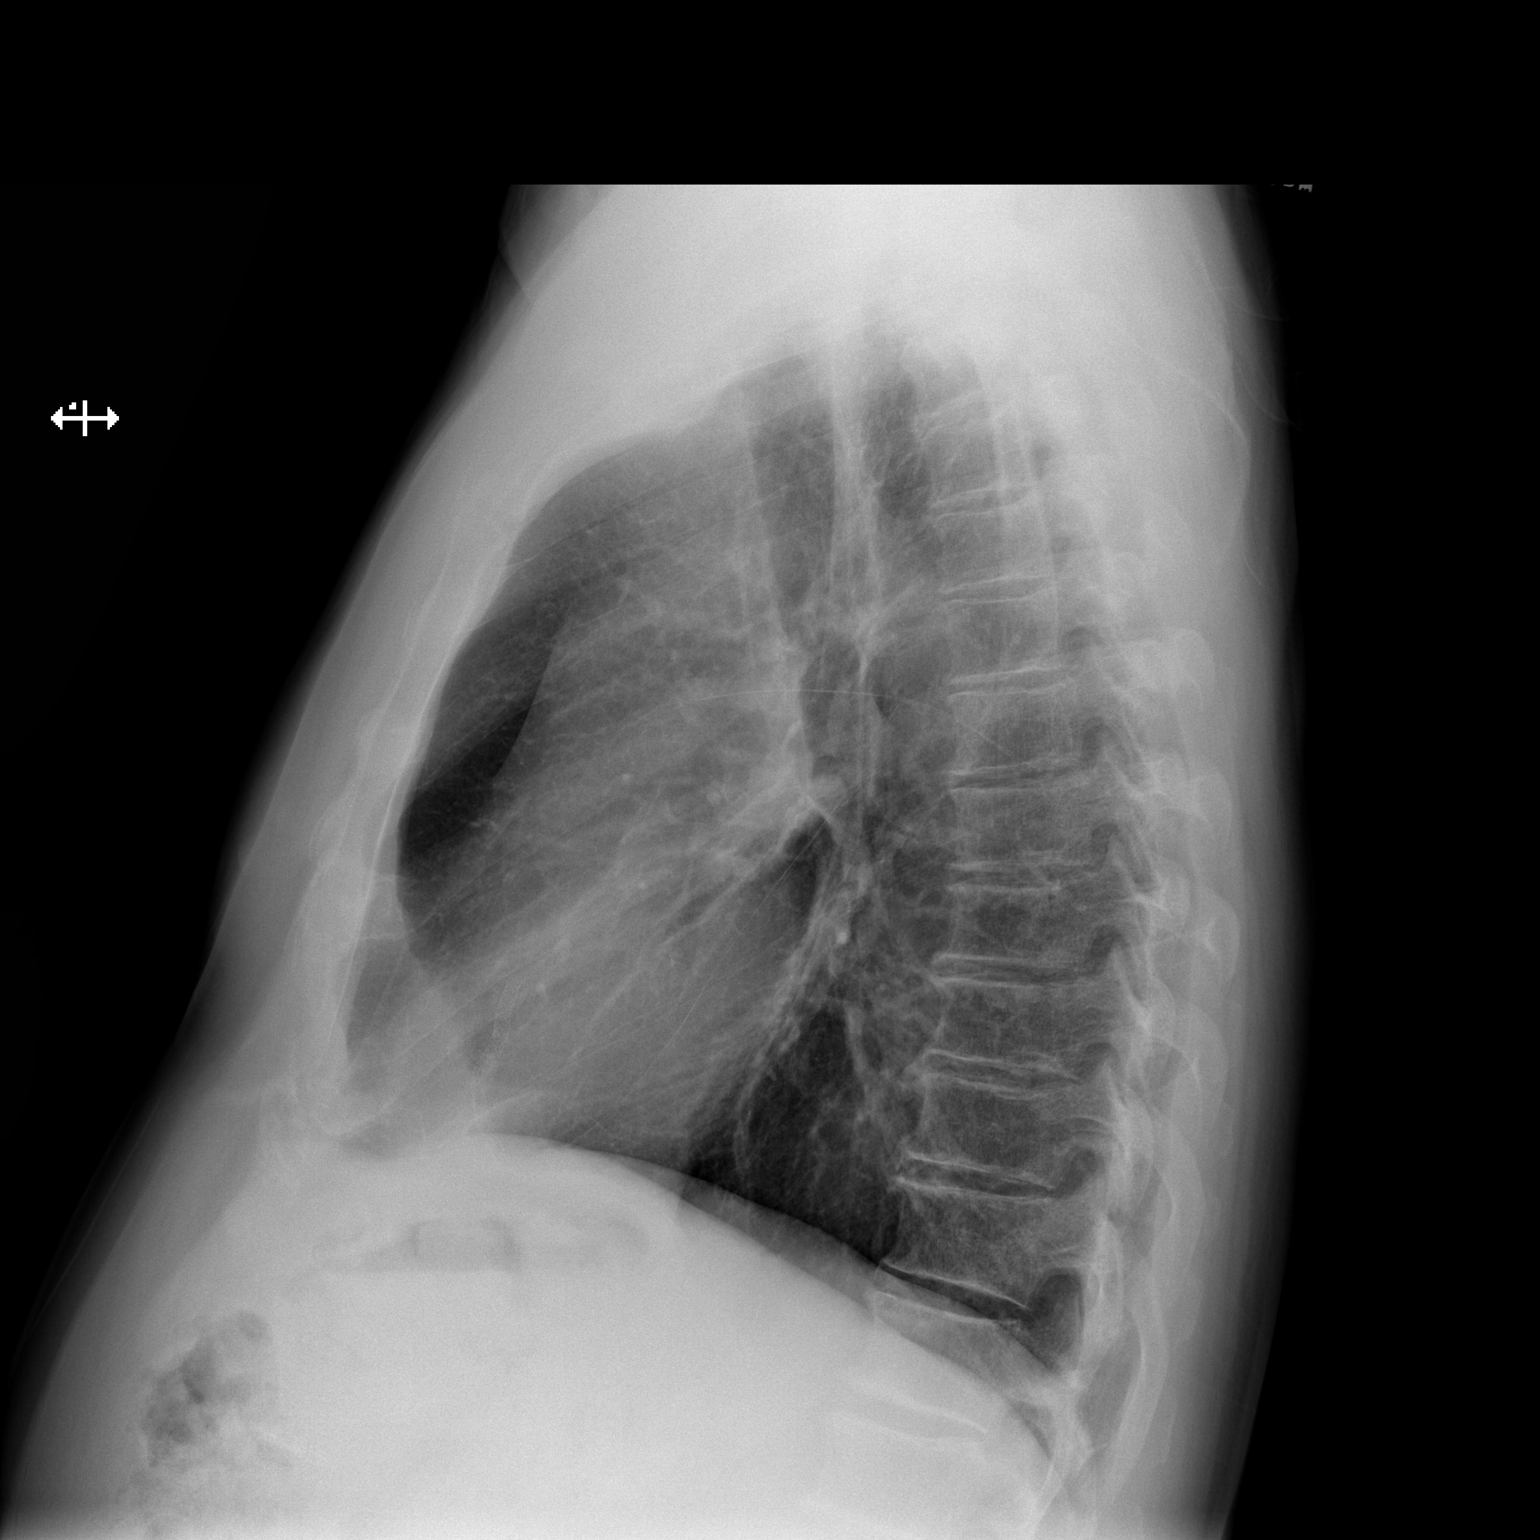

[2 of 2 positions shown; findings below may reference images not displayed]

FINDINGS: Biapical pleural/ parenchymal scarring. Lungs otherwise clear. Heart
is normal size. No effusions or acute bony abnormality.
IMPRESSION: Biapical scarring.  No active disease.

## 2016-01-16 IMAGING — CR DG CHEST 1V PORT
1 series · 1 of 1 positions shown · non-contrast
Comparison: 07/10/2015

CLINICAL DATA: 07-08-15 s/p CABG x 5 hyperlipidemia. Shortness of
breath.

EXAM:
PORTABLE CHEST 1 VIEW

[AP]
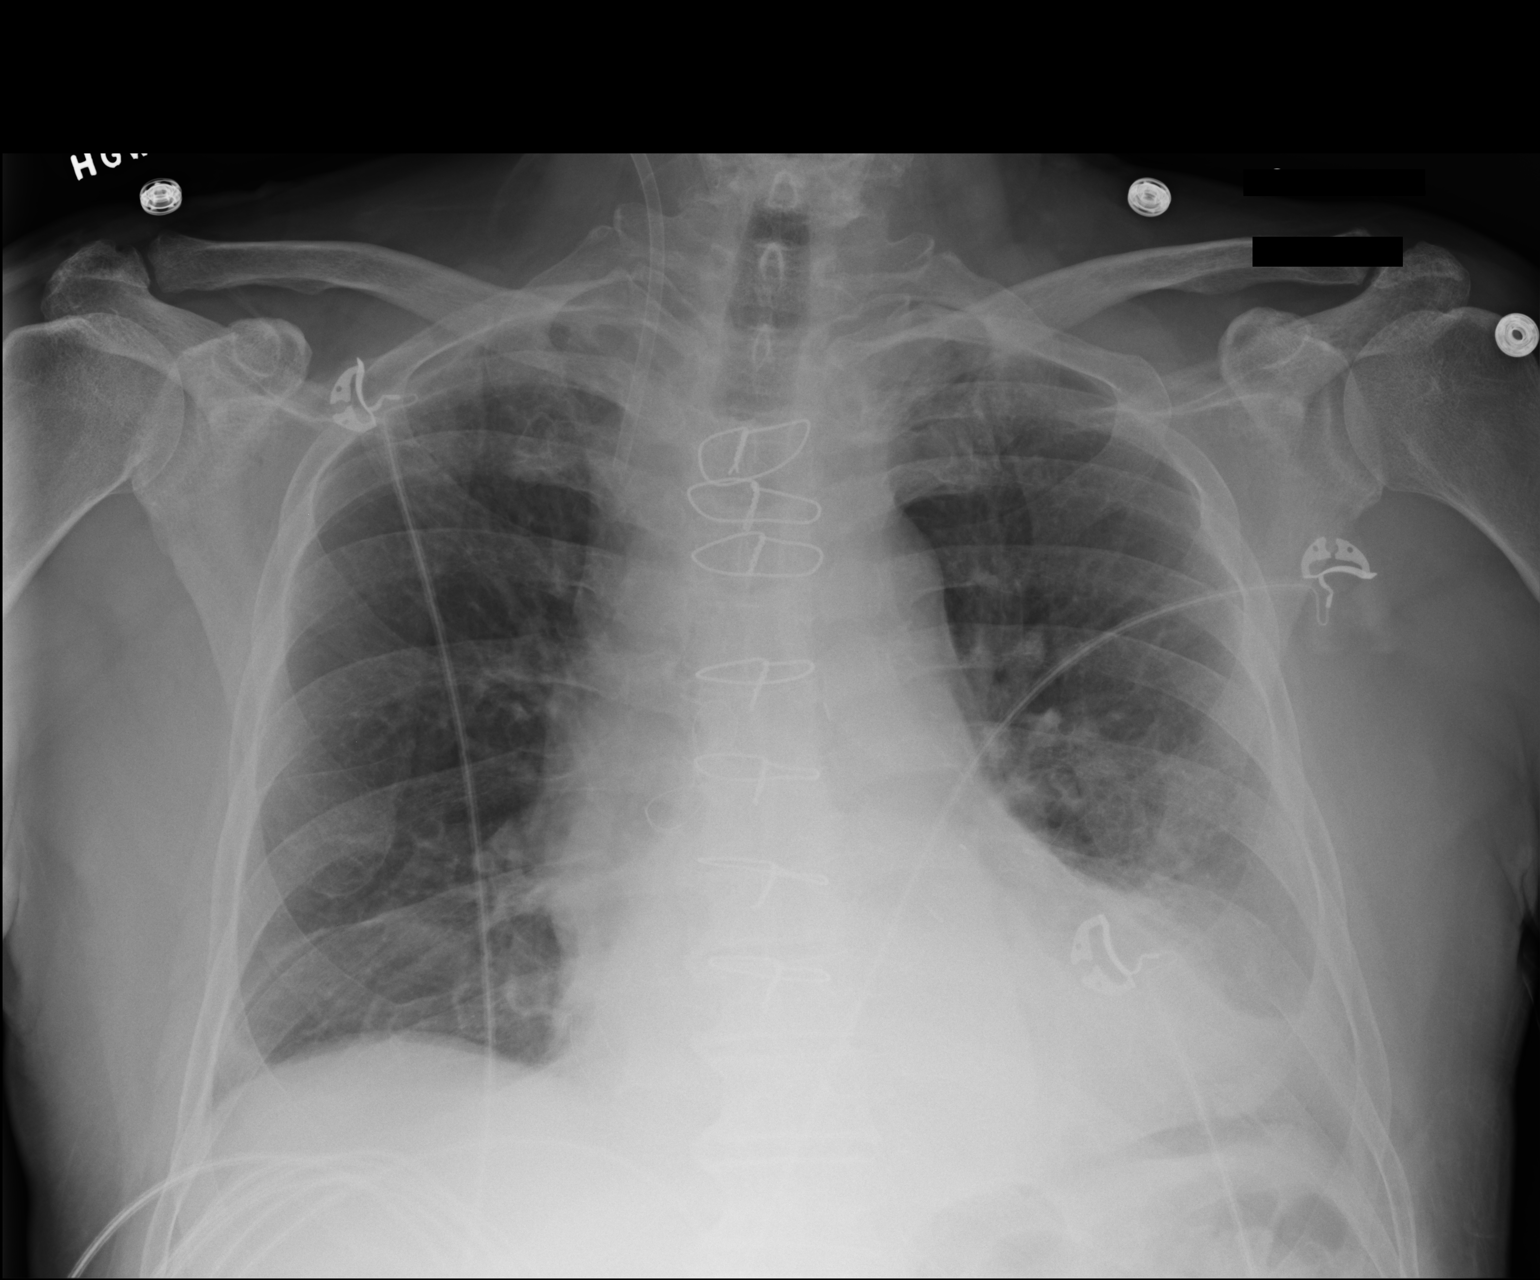

[1 of 1 positions shown; findings below may reference images not displayed]

FINDINGS: Right IJ Cordis sheath remains. Prior median sternotomy. Midline
trachea. Mild cardiomegaly. Small left pleural effusion. No
pneumothorax. No congestive failure. Left greater than right
bibasilar airspace disease.
IMPRESSION: No significant change since one day prior.

Small left pleural effusion with left greater than right bibasilar
atelectasis.

## 2016-02-11 ENCOUNTER — Other Ambulatory Visit: Payer: Self-pay | Admitting: *Deleted

## 2016-02-11 MED ORDER — CLOPIDOGREL BISULFATE 75 MG PO TABS: 75.0000 mg | ORAL_TABLET | Freq: Every day | ORAL | Status: DC

## 2016-02-11 MED ORDER — ATORVASTATIN CALCIUM 80 MG PO TABS: 80.0000 mg | ORAL_TABLET | Freq: Every day | ORAL | Status: DC

## 2016-06-12 ENCOUNTER — Telehealth: Payer: Self-pay | Admitting: Cardiology

## 2016-06-12 NOTE — Telephone Encounter (Signed)
Noted. Since isolated episode of possible angina and now back to baseline, would continue with observation. If the symptoms escalate, he should seek evaluation in the ER. Otherwise if remains stable, keep routine follow-up visit.

## 2016-06-12 NOTE — Telephone Encounter (Signed)
Geoffrey [name] called stating that yesterday morning he started having pain in his shoulders radiating into his arms.  States that his fingers became numb. No CP or Shortness of breath .please call [phone].

## 2016-06-12 NOTE — Telephone Encounter (Signed)
Patient notified

## 2016-06-12 NOTE — Telephone Encounter (Signed)
Lasted 10-15 minutes, then went away.  During that time was sitting down.  Did not use NTG because he didn't associate the pain with his heart.  Today, everything back to normal.  Does have 6 month follow up scheduled for 07/05/2016.

## 2016-07-04 NOTE — Progress Notes (Signed)
Cardiology Office Note  Date: 07/05/2016   ID: Geoffrey [name], DOB 06/24/1941, MRN [id number]  PCP: Geoffrey Labrum, MD  Primary Cardiologist: Rozann Lesches, MD   Chief Complaint  Patient presents with  . Coronary Artery Disease    History of Present Illness: Geoffrey [name] is a 75 y.o. male last seen in February. He presents for a follow-up visit. States he had an episode of upper back discomfort radiating to his arms about a month ago, occurred at rest in the morning. Symptoms lasted for about an hour and then went away. He did not take nitroglycerin. He had one additional episode of lightheadedness that again occurred in the morning after he got up, no pain at that time. He has had no other symptoms, and states that he feels well most of the time. He exercises 5 days a week at the [facility], about 45 minutes at a time, mainly walks on the treadmill. He has no symptoms when he exercises.  I reviewed his ECG today which shows sinus bradycardia with low voltage. He reports compliance with his medications which are outlined below. Does not report any hypotension when his wife checks his blood pressure at home.  Past Medical History:  Diagnosis Date  . Arthritis   . CAD (coronary artery disease)    DES to proximal and mid RCA May 23, 2015 - [place]; subsequent CABG 05/2015 Dr. Cyndia Bent  . Essential hypertension   . History of pneumonia   . Hyperlipidemia   . Inferior STEMI August 2016  . Postoperative atrial fibrillation ([place])   . Skin cancer    Removed from face and chest    Past Surgical History:  Procedure Laterality Date  . CARDIAC CATHETERIZATION    . COLONOSCOPY    . CORONARY ANGIOPLASTY  May 23, 2015   X 2 stents  . CORONARY ARTERY BYPASS GRAFT N/A 07/08/2015   Procedure: CORONARY ARTERY BYPASS GRAFTING (CABG), ON PUMP, TIMES FIVE, USING LEFT INTERNAL MAMMARY ARTERY, RIGHT GREATER SAPHENOUS VEIN HARVESTED ENDOSCOPICALLY;  Surgeon: Gaye Pollack, MD;  Location: [place];   Service: Open Heart Surgery;  Laterality: N/A;  . HERNIA REPAIR    . TEE WITHOUT CARDIOVERSION N/A 07/08/2015   Procedure: TRANSESOPHAGEAL ECHOCARDIOGRAM (TEE);  Surgeon: Gaye Pollack, MD;  Location: [place];  Service: Open Heart Surgery;  Laterality: N/A;    Current Outpatient Prescriptions  Medication Sig Dispense Refill  . atorvastatin (LIPITOR) 80 MG tablet Take 1 tablet (80 mg total) by mouth at bedtime. 90 tablet 3  . clopidogrel (PLAVIX) 75 MG tablet Take 1 tablet (75 mg total) by mouth daily. 90 tablet 3  . Cyanocobalamin (RAPID B-12 ENERGY PO) Take 1 tablet by mouth daily.    . diphenhydrAMINE (BENADRYL) 25 MG tablet Take 25 mg by mouth daily as needed.    Marland Kitchen losartan (COZAAR) 25 MG tablet Take 0.5 tablets (12.5 mg total) by mouth daily. 45 tablet 3  . metoprolol tartrate (LOPRESSOR) 25 MG tablet Take 25 mg by mouth 2 (two) times daily.    . Multiple Vitamin (MULTIVITAMIN) tablet Take 1 tablet by mouth daily.     No current facility-administered medications for this visit.    Allergies:  Lisinopril   Social History: The patient  reports that he quit smoking about 41 years ago. His smoking use included Cigarettes. He started smoking about 59 years ago. He has a 27.00 pack-year smoking history. He has never used smokeless tobacco. He reports that he does not  drink alcohol or use drugs.   ROS:  Please see the history of present illness. Otherwise, complete review of systems is positive for chronic lower back pain.  All other systems are reviewed and negative.   Physical Exam: VS:  BP 119/73   Pulse 64   Ht 5\' 7"  (1.702 m)   Wt 167 lb (75.8 kg)   BMI 26.16 kg/m , BMI Body mass index is 26.16 kg/m.  Wt Readings from Last 3 Encounters:  07/05/16 167 lb (75.8 kg)  11/18/15 165 lb 9.6 oz (75.1 kg)  09/09/15 163 lb 9.6 oz (74.2 kg)    General: Patient appears comfortable at rest. HEENT: Conjunctiva and lids normal, oropharynx clear. Neck: Supple, no elevated JVP or carotid  bruits, no thyromegaly. Thorax: Well-healing sternal incision. No erythema or drainage. Lungs: Clear without wheezing or rhonchi, nonlabored breathing at rest. Cardiac: Regular rate and rhythm, no S3, no pericardial rub. Abdomen: Soft, nontender, bowel sounds present, no guarding or rebound. Extremities: No pitting edema, vein harvest site stable on the right, distal pulses 2+.  ECG: I personally reviewed the tracing from 08/17/2015 which normal sinus rhythm possible old inferior infarct pattern.  Recent Labwork: 07/06/2015: ALT 25; AST 26 07/09/2015: Magnesium 2.5 07/12/2015: BUN 7; Creatinine, Ser 0.48; Hemoglobin 8.9; Platelets 183; Potassium 4.0; Sodium 136  December 2016: BUN 14, creatinine 0.8, potassium 4.8, AST 21, ALT 23, cholesterol 168, triglycerides 208, HDL 51, LDL 75  Other Studies Reviewed Today:  Echocardiogram 06/24/2015: Study Conclusions  - Left ventricle: The cavity size was normal. Wall thickness was normal. Systolic function was normal. The estimated ejection fraction was in the range of 55% to 60%. There is hypokinesis of the basalinferior myocardium. Doppler parameters are consistent with abnormal left ventricular relaxation (grade 1 diastolic dysfunction). - Aortic valve: Mildly calcified annulus. Trileaflet; mildly calcified leaflets. Cusp separation was mildly reduced. There was no significant regurgitation. Peak gradient (S): 9 mm Hg. Peak velocity ratio of LVOT to aortic valve: 0.84. Valve area (Vmax): 3.18 cm^2. - Mitral valve: There was trivial regurgitation. - Right atrium: Central venous pressure (est): 3 mm Hg. - Atrial septum: No defect or patent foramen ovale was identified. - Tricuspid valve: There was trivial regurgitation. - Pulmonary arteries: Systolic pressure could not be accurately estimated. - Pericardium, extracardiac: There was no pericardial effusion.  Impressions:  - Normal LV wall thickness with LVEF 55-60%,  basal inferior hypokinesis, and grade 1 diastolic dysfunction. Sclerotic aortic valve without stenosis. Trivial mitral and tricuspid regurgitation.  Assessment and Plan:  1. Multivessel CAD status post CABG in September 2016. He has had some symptoms over the last month as outlined above, but overall feels well in general and continues to exercise without angina or obvious limitation. His ECG is stable as well. We will continue with observation on medical therapy for now unless symptoms escalate.  2. Essential hypertension, blood pressure is well controlled today.  3. Hyperlipidemia, continues on Lipitor. I recommended that he follow with Dr. Pleas Koch for a repeat lipid panel, his last LDL was 75 in December 2016.  Current medicines were reviewed with the patient today.   Orders Placed This Encounter  Procedures  . EKG 12-Lead    Disposition: Follow up with me in 6 months.  Signed, Satira Sark, MD, [facility] 07/05/2016 8:11 AM    [place] at [place], [place], [place] [address] Phone: [phone]; Fax: [phone]

## 2016-07-05 ENCOUNTER — Ambulatory Visit (INDEPENDENT_AMBULATORY_CARE_PROVIDER_SITE_OTHER): Payer: Commercial Managed Care - HMO | Admitting: Cardiology

## 2016-07-05 ENCOUNTER — Encounter: Payer: Self-pay | Admitting: Cardiology

## 2016-07-05 VITALS — BP 119/73 | HR 64 | Ht 67.0 in | Wt 167.0 lb

## 2016-07-05 DIAGNOSIS — I2510 Atherosclerotic heart disease of native coronary artery without angina pectoris: Secondary | ICD-10-CM

## 2016-07-05 DIAGNOSIS — Z8679 Personal history of other diseases of the circulatory system: Secondary | ICD-10-CM | POA: Diagnosis not present

## 2016-07-05 DIAGNOSIS — E782 Mixed hyperlipidemia: Secondary | ICD-10-CM | POA: Diagnosis not present

## 2016-07-05 NOTE — Patient Instructions (Signed)

## 2016-07-27 ENCOUNTER — Other Ambulatory Visit: Payer: Self-pay | Admitting: Cardiology

## 2016-10-02 DIAGNOSIS — I2582 Chronic total occlusion of coronary artery: Secondary | ICD-10-CM | POA: Diagnosis not present

## 2016-10-02 DIAGNOSIS — E782 Mixed hyperlipidemia: Secondary | ICD-10-CM | POA: Diagnosis not present

## 2016-10-02 DIAGNOSIS — R7301 Impaired fasting glucose: Secondary | ICD-10-CM | POA: Diagnosis not present

## 2016-10-04 DIAGNOSIS — I2582 Chronic total occlusion of coronary artery: Secondary | ICD-10-CM | POA: Diagnosis not present

## 2016-10-04 DIAGNOSIS — Z955 Presence of coronary angioplasty implant and graft: Secondary | ICD-10-CM | POA: Diagnosis not present

## 2016-10-04 DIAGNOSIS — I2111 ST elevation (STEMI) myocardial infarction involving right coronary artery: Secondary | ICD-10-CM | POA: Diagnosis not present

## 2016-10-04 DIAGNOSIS — R7301 Impaired fasting glucose: Secondary | ICD-10-CM | POA: Diagnosis not present

## 2016-10-04 DIAGNOSIS — E782 Mixed hyperlipidemia: Secondary | ICD-10-CM | POA: Diagnosis not present

## 2016-10-04 DIAGNOSIS — Z6827 Body mass index (BMI) 27.0-27.9, adult: Secondary | ICD-10-CM | POA: Diagnosis not present

## 2016-10-16 ENCOUNTER — Other Ambulatory Visit: Payer: Self-pay | Admitting: *Deleted

## 2016-10-16 MED ORDER — METOPROLOL TARTRATE 25 MG PO TABS: 25.0000 mg | ORAL_TABLET | Freq: Two times a day (BID) | ORAL | 3 refills | Status: AC

## 2016-10-16 MED ORDER — ATORVASTATIN CALCIUM 80 MG PO TABS: 80.0000 mg | ORAL_TABLET | Freq: Every day | ORAL | 3 refills | Status: AC

## 2016-10-16 MED ORDER — LOSARTAN POTASSIUM 25 MG PO TABS: 12.5000 mg | ORAL_TABLET | Freq: Every day | ORAL | 3 refills | Status: AC

## 2016-10-16 MED ORDER — CLOPIDOGREL BISULFATE 75 MG PO TABS: 75.0000 mg | ORAL_TABLET | Freq: Every day | ORAL | 3 refills | Status: AC

## 2016-10-24 DIAGNOSIS — S161XXA Strain of muscle, fascia and tendon at neck level, initial encounter: Secondary | ICD-10-CM | POA: Diagnosis not present

## 2016-10-24 DIAGNOSIS — Z6826 Body mass index (BMI) 26.0-26.9, adult: Secondary | ICD-10-CM | POA: Diagnosis not present

## 2017-02-21 ENCOUNTER — Telehealth: Payer: Self-pay | Admitting: Cardiology

## 2017-02-21 NOTE — Telephone Encounter (Signed)
Patient walked into the office to let us know that he will be moving out of state within the next two weeks.

## 2017-02-23 DIAGNOSIS — H9193 Unspecified hearing loss, bilateral: Secondary | ICD-10-CM | POA: Diagnosis not present

## 2017-02-23 DIAGNOSIS — H6123 Impacted cerumen, bilateral: Secondary | ICD-10-CM | POA: Diagnosis not present

## 2017-02-23 DIAGNOSIS — Z6825 Body mass index (BMI) 25.0-25.9, adult: Secondary | ICD-10-CM | POA: Diagnosis not present

## 2017-05-17 DIAGNOSIS — I213 ST elevation (STEMI) myocardial infarction of unspecified site: Secondary | ICD-10-CM | POA: Diagnosis not present

## 2017-05-17 DIAGNOSIS — R69 Illness, unspecified: Secondary | ICD-10-CM | POA: Diagnosis not present

## 2017-05-17 DIAGNOSIS — E785 Hyperlipidemia, unspecified: Secondary | ICD-10-CM | POA: Diagnosis not present

## 2017-05-17 DIAGNOSIS — G4762 Sleep related leg cramps: Secondary | ICD-10-CM | POA: Diagnosis not present

## 2017-05-25 DIAGNOSIS — I213 ST elevation (STEMI) myocardial infarction of unspecified site: Secondary | ICD-10-CM | POA: Diagnosis not present

## 2017-05-25 DIAGNOSIS — E785 Hyperlipidemia, unspecified: Secondary | ICD-10-CM | POA: Diagnosis not present

## 2017-06-05 DIAGNOSIS — H9113 Presbycusis, bilateral: Secondary | ICD-10-CM | POA: Diagnosis not present

## 2017-06-05 DIAGNOSIS — K229 Disease of esophagus, unspecified: Secondary | ICD-10-CM | POA: Diagnosis not present

## 2017-06-05 DIAGNOSIS — I10 Essential (primary) hypertension: Secondary | ICD-10-CM | POA: Diagnosis not present

## 2017-06-05 DIAGNOSIS — R011 Cardiac murmur, unspecified: Secondary | ICD-10-CM | POA: Diagnosis not present

## 2017-06-05 DIAGNOSIS — R69 Illness, unspecified: Secondary | ICD-10-CM | POA: Diagnosis not present

## 2017-06-05 DIAGNOSIS — Z0000 Encounter for general adult medical examination without abnormal findings: Secondary | ICD-10-CM | POA: Diagnosis not present

## 2017-06-05 DIAGNOSIS — R233 Spontaneous ecchymoses: Secondary | ICD-10-CM | POA: Diagnosis not present

## 2017-06-05 DIAGNOSIS — M545 Low back pain: Secondary | ICD-10-CM | POA: Diagnosis not present

## 2017-06-05 DIAGNOSIS — R42 Dizziness and giddiness: Secondary | ICD-10-CM | POA: Diagnosis not present

## 2017-06-05 DIAGNOSIS — E7800 Pure hypercholesterolemia, unspecified: Secondary | ICD-10-CM | POA: Diagnosis not present

## 2017-06-15 DIAGNOSIS — I2510 Atherosclerotic heart disease of native coronary artery without angina pectoris: Secondary | ICD-10-CM | POA: Diagnosis not present

## 2017-06-15 DIAGNOSIS — I213 ST elevation (STEMI) myocardial infarction of unspecified site: Secondary | ICD-10-CM | POA: Diagnosis not present
